# Patient Record
Sex: Female | Born: 1935 | Race: White | Hispanic: No | Smoking: Never smoker
Health system: Southern US, Community
[De-identification: ages and names within clinical notes are randomized; demographics above are authoritative.]

## PROBLEM LIST (undated history)

## (undated) DIAGNOSIS — I10 Essential (primary) hypertension: Secondary | ICD-10-CM

## (undated) DIAGNOSIS — E039 Hypothyroidism, unspecified: Secondary | ICD-10-CM

## (undated) DIAGNOSIS — R609 Edema, unspecified: Secondary | ICD-10-CM

## (undated) DIAGNOSIS — H8109 Meniere's disease, unspecified ear: Secondary | ICD-10-CM

## (undated) DIAGNOSIS — G2581 Restless legs syndrome: Secondary | ICD-10-CM

## (undated) DIAGNOSIS — I509 Heart failure, unspecified: Secondary | ICD-10-CM

## (undated) DIAGNOSIS — I4891 Unspecified atrial fibrillation: Secondary | ICD-10-CM

## (undated) DIAGNOSIS — F419 Anxiety disorder, unspecified: Secondary | ICD-10-CM

## (undated) HISTORY — PX: ABDOMINAL HYSTERECTOMY: SHX81

## (undated) HISTORY — PX: CATARACT EXTRACTION: SUR2

## (undated) HISTORY — PX: THYROID SURGERY: SHX805

## (undated) HISTORY — PX: CHOLECYSTECTOMY: SHX55

---

## 2003-07-05 ENCOUNTER — Other Ambulatory Visit: Payer: Self-pay

## 2003-07-07 ENCOUNTER — Other Ambulatory Visit: Payer: Self-pay

## 2004-03-16 ENCOUNTER — Ambulatory Visit: Payer: Self-pay | Admitting: Internal Medicine

## 2004-07-21 ENCOUNTER — Ambulatory Visit: Payer: Self-pay | Admitting: Internal Medicine

## 2004-10-08 ENCOUNTER — Inpatient Hospital Stay: Payer: Self-pay | Admitting: Internal Medicine

## 2005-03-20 ENCOUNTER — Ambulatory Visit: Payer: Self-pay | Admitting: Internal Medicine

## 2005-04-03 ENCOUNTER — Ambulatory Visit: Payer: Self-pay | Admitting: Internal Medicine

## 2006-03-21 ENCOUNTER — Ambulatory Visit: Payer: Self-pay | Admitting: Internal Medicine

## 2007-03-25 ENCOUNTER — Ambulatory Visit: Payer: Self-pay | Admitting: Internal Medicine

## 2007-10-22 ENCOUNTER — Ambulatory Visit: Payer: Self-pay | Admitting: Internal Medicine

## 2008-10-14 ENCOUNTER — Ambulatory Visit: Payer: Self-pay | Admitting: Internal Medicine

## 2009-03-09 ENCOUNTER — Ambulatory Visit: Payer: Self-pay | Admitting: Internal Medicine

## 2009-08-23 ENCOUNTER — Ambulatory Visit: Payer: Self-pay | Admitting: Internal Medicine

## 2009-12-12 ENCOUNTER — Emergency Department: Payer: Self-pay | Admitting: Emergency Medicine

## 2010-01-20 ENCOUNTER — Ambulatory Visit: Payer: Self-pay | Admitting: Internal Medicine

## 2010-04-26 ENCOUNTER — Ambulatory Visit: Payer: Self-pay | Admitting: Internal Medicine

## 2010-05-11 ENCOUNTER — Inpatient Hospital Stay: Payer: Self-pay | Admitting: Internal Medicine

## 2010-07-31 ENCOUNTER — Inpatient Hospital Stay: Payer: Self-pay | Admitting: Internal Medicine

## 2011-02-06 ENCOUNTER — Ambulatory Visit: Payer: Self-pay | Admitting: Internal Medicine

## 2011-03-29 ENCOUNTER — Ambulatory Visit: Payer: Self-pay | Admitting: Internal Medicine

## 2011-03-29 LAB — BASIC METABOLIC PANEL
Anion Gap: 7 (ref 7–16)
BUN: 20 mg/dL — ABNORMAL HIGH (ref 7–18)
Calcium, Total: 9.6 mg/dL (ref 8.5–10.1)
Chloride: 101 mmol/L (ref 98–107)
Co2: 32 mmol/L (ref 21–32)
Creatinine: 0.9 mg/dL (ref 0.60–1.30)
EGFR (African American): >60
EGFR (Non-African Amer.): >60
Glucose: 114 mg/dL — ABNORMAL HIGH (ref 65–99)
Osmolality: 283 (ref 275–301)
Potassium: 4.2 mmol/L (ref 3.5–5.1)
Sodium: 140 mmol/L (ref 136–145)

## 2011-03-29 LAB — CBC WITH DIFFERENTIAL/PLATELET
Basophil #: 0 10*3/uL (ref 0.0–0.1)
Basophil %: 0.3 %
Eosinophil #: 0 10*3/uL (ref 0.0–0.7)
Eosinophil %: 0.6 %
HCT: 34.2 % — ABNORMAL LOW (ref 35.0–47.0)
HGB: 10.9 g/dL — ABNORMAL LOW (ref 12.0–16.0)
Lymphocyte #: 0.7 10*3/uL — ABNORMAL LOW (ref 1.0–3.6)
Lymphocyte %: 11.1 %
MCH: 27.2 pg (ref 26.0–34.0)
MCHC: 31.9 g/dL — ABNORMAL LOW (ref 32.0–36.0)
MCV: 85 fL (ref 80–100)
Monocyte #: 0.5 10*3/uL (ref 0.0–0.7)
Monocyte %: 7.6 %
Neutrophil #: 4.8 10*3/uL (ref 1.4–6.5)
Neutrophil %: 80.4 %
Platelet: 186 10*3/uL (ref 150–440)
RBC: 4.01 10*6/uL (ref 3.80–5.20)
RDW: 18 % — ABNORMAL HIGH (ref 11.5–14.5)
WBC: 6 10*3/uL (ref 3.6–11.0)

## 2011-03-29 LAB — TSH: Thyroid Stimulating Horm: 3.67 u[IU]/mL

## 2011-03-29 LAB — TROPONIN I: Troponin-I: 0.04 ng/mL

## 2011-03-29 LAB — CK: CK, Total: 87 U/L (ref 21–215)

## 2011-03-29 LAB — CK-MB: CK-MB: 3.9 ng/mL — ABNORMAL HIGH (ref 0.5–3.6)

## 2011-04-19 ENCOUNTER — Ambulatory Visit: Payer: Self-pay | Admitting: Internal Medicine

## 2013-05-01 ENCOUNTER — Ambulatory Visit: Payer: Self-pay | Admitting: Internal Medicine

## 2014-02-27 DIAGNOSIS — I4891 Unspecified atrial fibrillation: Secondary | ICD-10-CM | POA: Diagnosis not present

## 2014-02-27 DIAGNOSIS — I509 Heart failure, unspecified: Secondary | ICD-10-CM | POA: Diagnosis not present

## 2014-02-27 DIAGNOSIS — Z880 Allergy status to penicillin: Secondary | ICD-10-CM | POA: Diagnosis not present

## 2014-02-27 DIAGNOSIS — Z7901 Long term (current) use of anticoagulants: Secondary | ICD-10-CM | POA: Diagnosis not present

## 2014-02-27 DIAGNOSIS — I517 Cardiomegaly: Secondary | ICD-10-CM | POA: Diagnosis not present

## 2014-02-28 DIAGNOSIS — G4733 Obstructive sleep apnea (adult) (pediatric): Secondary | ICD-10-CM | POA: Diagnosis not present

## 2014-03-12 DIAGNOSIS — I4891 Unspecified atrial fibrillation: Secondary | ICD-10-CM | POA: Diagnosis not present

## 2014-03-13 ENCOUNTER — Ambulatory Visit: Payer: Self-pay | Admitting: Internal Medicine

## 2014-03-13 DIAGNOSIS — I517 Cardiomegaly: Secondary | ICD-10-CM | POA: Diagnosis not present

## 2014-03-13 DIAGNOSIS — R0602 Shortness of breath: Secondary | ICD-10-CM | POA: Diagnosis not present

## 2014-03-13 DIAGNOSIS — J90 Pleural effusion, not elsewhere classified: Secondary | ICD-10-CM | POA: Diagnosis not present

## 2014-03-17 DIAGNOSIS — I4891 Unspecified atrial fibrillation: Secondary | ICD-10-CM | POA: Diagnosis not present

## 2014-03-17 DIAGNOSIS — I517 Cardiomegaly: Secondary | ICD-10-CM | POA: Diagnosis not present

## 2014-03-17 DIAGNOSIS — E784 Other hyperlipidemia: Secondary | ICD-10-CM | POA: Diagnosis not present

## 2014-03-17 DIAGNOSIS — I509 Heart failure, unspecified: Secondary | ICD-10-CM | POA: Diagnosis not present

## 2014-03-30 DIAGNOSIS — G4733 Obstructive sleep apnea (adult) (pediatric): Secondary | ICD-10-CM | POA: Diagnosis not present

## 2014-03-31 DIAGNOSIS — E784 Other hyperlipidemia: Secondary | ICD-10-CM | POA: Diagnosis not present

## 2014-03-31 DIAGNOSIS — I4891 Unspecified atrial fibrillation: Secondary | ICD-10-CM | POA: Diagnosis not present

## 2014-03-31 DIAGNOSIS — R5381 Other malaise: Secondary | ICD-10-CM | POA: Diagnosis not present

## 2014-03-31 DIAGNOSIS — I10 Essential (primary) hypertension: Secondary | ICD-10-CM | POA: Diagnosis not present

## 2014-03-31 DIAGNOSIS — I509 Heart failure, unspecified: Secondary | ICD-10-CM | POA: Diagnosis not present

## 2014-03-31 DIAGNOSIS — G2581 Restless legs syndrome: Secondary | ICD-10-CM | POA: Diagnosis not present

## 2014-04-29 DIAGNOSIS — G4733 Obstructive sleep apnea (adult) (pediatric): Secondary | ICD-10-CM | POA: Diagnosis not present

## 2014-05-04 DIAGNOSIS — I4891 Unspecified atrial fibrillation: Secondary | ICD-10-CM | POA: Diagnosis not present

## 2014-05-04 DIAGNOSIS — I10 Essential (primary) hypertension: Secondary | ICD-10-CM | POA: Diagnosis not present

## 2014-05-04 DIAGNOSIS — I509 Heart failure, unspecified: Secondary | ICD-10-CM | POA: Diagnosis not present

## 2014-05-30 DIAGNOSIS — G4733 Obstructive sleep apnea (adult) (pediatric): Secondary | ICD-10-CM | POA: Diagnosis not present

## 2014-06-01 DIAGNOSIS — I4891 Unspecified atrial fibrillation: Secondary | ICD-10-CM | POA: Diagnosis not present

## 2014-06-01 DIAGNOSIS — I209 Angina pectoris, unspecified: Secondary | ICD-10-CM | POA: Diagnosis not present

## 2014-06-01 DIAGNOSIS — E784 Other hyperlipidemia: Secondary | ICD-10-CM | POA: Diagnosis not present

## 2014-06-01 DIAGNOSIS — I10 Essential (primary) hypertension: Secondary | ICD-10-CM | POA: Diagnosis not present

## 2014-06-29 DIAGNOSIS — G4733 Obstructive sleep apnea (adult) (pediatric): Secondary | ICD-10-CM | POA: Diagnosis not present

## 2014-07-06 DIAGNOSIS — I4891 Unspecified atrial fibrillation: Secondary | ICD-10-CM | POA: Diagnosis not present

## 2014-07-06 DIAGNOSIS — G2581 Restless legs syndrome: Secondary | ICD-10-CM | POA: Diagnosis not present

## 2014-07-06 DIAGNOSIS — I209 Angina pectoris, unspecified: Secondary | ICD-10-CM | POA: Diagnosis not present

## 2014-07-06 DIAGNOSIS — M7051 Other bursitis of knee, right knee: Secondary | ICD-10-CM | POA: Diagnosis not present

## 2014-07-06 DIAGNOSIS — I509 Heart failure, unspecified: Secondary | ICD-10-CM | POA: Diagnosis not present

## 2014-07-13 DIAGNOSIS — H40013 Open angle with borderline findings, low risk, bilateral: Secondary | ICD-10-CM | POA: Diagnosis not present

## 2014-07-13 DIAGNOSIS — H524 Presbyopia: Secondary | ICD-10-CM | POA: Diagnosis not present

## 2014-07-13 DIAGNOSIS — H35033 Hypertensive retinopathy, bilateral: Secondary | ICD-10-CM | POA: Diagnosis not present

## 2014-07-30 DIAGNOSIS — G4733 Obstructive sleep apnea (adult) (pediatric): Secondary | ICD-10-CM | POA: Diagnosis not present

## 2014-08-10 DIAGNOSIS — E784 Other hyperlipidemia: Secondary | ICD-10-CM | POA: Diagnosis not present

## 2014-08-10 DIAGNOSIS — N182 Chronic kidney disease, stage 2 (mild): Secondary | ICD-10-CM | POA: Diagnosis not present

## 2014-08-10 DIAGNOSIS — I130 Hypertensive heart and chronic kidney disease with heart failure and stage 1 through stage 4 chronic kidney disease, or unspecified chronic kidney disease: Secondary | ICD-10-CM | POA: Diagnosis not present

## 2014-08-10 DIAGNOSIS — I4891 Unspecified atrial fibrillation: Secondary | ICD-10-CM | POA: Diagnosis not present

## 2014-08-28 ENCOUNTER — Inpatient Hospital Stay
Admission: EM | Admit: 2014-08-28 | Discharge: 2014-09-01 | DRG: 291 | Disposition: A | Discharge status: Skilled Nursing Facility | Payer: Medicare Other | Attending: Internal Medicine | Admitting: Internal Medicine

## 2014-08-28 ENCOUNTER — Encounter: Payer: Self-pay | Admitting: Emergency Medicine

## 2014-08-28 ENCOUNTER — Emergency Department: Payer: Medicare Other

## 2014-08-28 ENCOUNTER — Inpatient Hospital Stay
Admit: 2014-08-28 | Discharge: 2014-08-28 | Disposition: A | Discharge status: Home / Self Care | Payer: Medicare Other | Attending: Internal Medicine | Admitting: Internal Medicine

## 2014-08-28 ENCOUNTER — Inpatient Hospital Stay: Payer: Medicare Other

## 2014-08-28 DIAGNOSIS — L539 Erythematous condition, unspecified: Secondary | ICD-10-CM | POA: Diagnosis not present

## 2014-08-28 DIAGNOSIS — N183 Chronic kidney disease, stage 3 (moderate): Secondary | ICD-10-CM | POA: Diagnosis present

## 2014-08-28 DIAGNOSIS — J811 Chronic pulmonary edema: Secondary | ICD-10-CM | POA: Diagnosis not present

## 2014-08-28 DIAGNOSIS — I480 Paroxysmal atrial fibrillation: Secondary | ICD-10-CM | POA: Diagnosis present

## 2014-08-28 DIAGNOSIS — E8770 Fluid overload, unspecified: Secondary | ICD-10-CM

## 2014-08-28 DIAGNOSIS — I4891 Unspecified atrial fibrillation: Secondary | ICD-10-CM | POA: Diagnosis not present

## 2014-08-28 DIAGNOSIS — D649 Anemia, unspecified: Secondary | ICD-10-CM | POA: Diagnosis present

## 2014-08-28 DIAGNOSIS — E039 Hypothyroidism, unspecified: Secondary | ICD-10-CM | POA: Diagnosis not present

## 2014-08-28 DIAGNOSIS — T460X1A Poisoning by cardiac-stimulant glycosides and drugs of similar action, accidental (unintentional), initial encounter: Secondary | ICD-10-CM

## 2014-08-28 DIAGNOSIS — E876 Hypokalemia: Secondary | ICD-10-CM | POA: Diagnosis not present

## 2014-08-28 DIAGNOSIS — I340 Nonrheumatic mitral (valve) insufficiency: Secondary | ICD-10-CM | POA: Diagnosis present

## 2014-08-28 DIAGNOSIS — Z823 Family history of stroke: Secondary | ICD-10-CM

## 2014-08-28 DIAGNOSIS — R001 Bradycardia, unspecified: Secondary | ICD-10-CM | POA: Diagnosis not present

## 2014-08-28 DIAGNOSIS — R0600 Dyspnea, unspecified: Secondary | ICD-10-CM

## 2014-08-28 DIAGNOSIS — R710 Precipitous drop in hematocrit: Secondary | ICD-10-CM | POA: Diagnosis not present

## 2014-08-28 DIAGNOSIS — I5033 Acute on chronic diastolic (congestive) heart failure: Principal | ICD-10-CM | POA: Diagnosis present

## 2014-08-28 DIAGNOSIS — Z9981 Dependence on supplemental oxygen: Secondary | ICD-10-CM | POA: Diagnosis not present

## 2014-08-28 DIAGNOSIS — I10 Essential (primary) hypertension: Secondary | ICD-10-CM | POA: Diagnosis not present

## 2014-08-28 DIAGNOSIS — E8779 Other fluid overload: Secondary | ICD-10-CM

## 2014-08-28 DIAGNOSIS — F419 Anxiety disorder, unspecified: Secondary | ICD-10-CM | POA: Diagnosis present

## 2014-08-28 DIAGNOSIS — T460X5D Adverse effect of cardiac-stimulant glycosides and drugs of similar action, subsequent encounter: Secondary | ICD-10-CM | POA: Diagnosis not present

## 2014-08-28 DIAGNOSIS — J9811 Atelectasis: Secondary | ICD-10-CM | POA: Diagnosis not present

## 2014-08-28 DIAGNOSIS — K219 Gastro-esophageal reflux disease without esophagitis: Secondary | ICD-10-CM | POA: Diagnosis not present

## 2014-08-28 DIAGNOSIS — I442 Atrioventricular block, complete: Secondary | ICD-10-CM | POA: Diagnosis not present

## 2014-08-28 DIAGNOSIS — Z66 Do not resuscitate: Secondary | ICD-10-CM | POA: Diagnosis present

## 2014-08-28 DIAGNOSIS — Z79899 Other long term (current) drug therapy: Secondary | ICD-10-CM

## 2014-08-28 DIAGNOSIS — J9690 Respiratory failure, unspecified, unspecified whether with hypoxia or hypercapnia: Secondary | ICD-10-CM | POA: Diagnosis not present

## 2014-08-28 DIAGNOSIS — Z7901 Long term (current) use of anticoagulants: Secondary | ICD-10-CM

## 2014-08-28 DIAGNOSIS — T460X5A Adverse effect of cardiac-stimulant glycosides and drugs of similar action, initial encounter: Secondary | ICD-10-CM | POA: Diagnosis present

## 2014-08-28 DIAGNOSIS — E785 Hyperlipidemia, unspecified: Secondary | ICD-10-CM | POA: Diagnosis present

## 2014-08-28 DIAGNOSIS — Z90710 Acquired absence of both cervix and uterus: Secondary | ICD-10-CM

## 2014-08-28 DIAGNOSIS — G2581 Restless legs syndrome: Secondary | ICD-10-CM | POA: Diagnosis present

## 2014-08-28 DIAGNOSIS — R0602 Shortness of breath: Secondary | ICD-10-CM | POA: Diagnosis not present

## 2014-08-28 DIAGNOSIS — H8109 Meniere's disease, unspecified ear: Secondary | ICD-10-CM | POA: Diagnosis not present

## 2014-08-28 DIAGNOSIS — M6281 Muscle weakness (generalized): Secondary | ICD-10-CM | POA: Diagnosis not present

## 2014-08-28 DIAGNOSIS — G4733 Obstructive sleep apnea (adult) (pediatric): Secondary | ICD-10-CM | POA: Diagnosis not present

## 2014-08-28 DIAGNOSIS — Z8249 Family history of ischemic heart disease and other diseases of the circulatory system: Secondary | ICD-10-CM | POA: Diagnosis not present

## 2014-08-28 DIAGNOSIS — Z5181 Encounter for therapeutic drug level monitoring: Secondary | ICD-10-CM | POA: Diagnosis not present

## 2014-08-28 DIAGNOSIS — E875 Hyperkalemia: Secondary | ICD-10-CM | POA: Diagnosis present

## 2014-08-28 DIAGNOSIS — I129 Hypertensive chronic kidney disease with stage 1 through stage 4 chronic kidney disease, or unspecified chronic kidney disease: Secondary | ICD-10-CM | POA: Diagnosis present

## 2014-08-28 DIAGNOSIS — I509 Heart failure, unspecified: Secondary | ICD-10-CM | POA: Diagnosis not present

## 2014-08-28 DIAGNOSIS — J9601 Acute respiratory failure with hypoxia: Secondary | ICD-10-CM

## 2014-08-28 DIAGNOSIS — Z9849 Cataract extraction status, unspecified eye: Secondary | ICD-10-CM

## 2014-08-28 DIAGNOSIS — Z9049 Acquired absence of other specified parts of digestive tract: Secondary | ICD-10-CM | POA: Diagnosis not present

## 2014-08-28 DIAGNOSIS — Z515 Encounter for palliative care: Secondary | ICD-10-CM | POA: Diagnosis not present

## 2014-08-28 DIAGNOSIS — J810 Acute pulmonary edema: Secondary | ICD-10-CM

## 2014-08-28 DIAGNOSIS — N179 Acute kidney failure, unspecified: Secondary | ICD-10-CM | POA: Diagnosis not present

## 2014-08-28 DIAGNOSIS — I350 Nonrheumatic aortic (valve) stenosis: Secondary | ICD-10-CM | POA: Diagnosis not present

## 2014-08-28 DIAGNOSIS — I5032 Chronic diastolic (congestive) heart failure: Secondary | ICD-10-CM | POA: Diagnosis not present

## 2014-08-28 DIAGNOSIS — I5031 Acute diastolic (congestive) heart failure: Secondary | ICD-10-CM | POA: Diagnosis not present

## 2014-08-28 HISTORY — DX: Unspecified atrial fibrillation: I48.91

## 2014-08-28 HISTORY — DX: Restless legs syndrome: G25.81

## 2014-08-28 HISTORY — DX: Anxiety disorder, unspecified: F41.9

## 2014-08-28 HISTORY — DX: Edema, unspecified: R60.9

## 2014-08-28 HISTORY — DX: Meniere's disease, unspecified ear: H81.09

## 2014-08-28 HISTORY — DX: Hypothyroidism, unspecified: E03.9

## 2014-08-28 HISTORY — DX: Heart failure, unspecified: I50.9

## 2014-08-28 HISTORY — DX: Essential (primary) hypertension: I10

## 2014-08-28 LAB — BASIC METABOLIC PANEL
ANION GAP: 10 (ref 5–15)
Anion gap: 8 (ref 5–15)
BUN: 42 mg/dL — ABNORMAL HIGH (ref 6–20)
BUN: 43 mg/dL — ABNORMAL HIGH (ref 6–20)
CALCIUM: 11.2 mg/dL — AB (ref 8.9–10.3)
CHLORIDE: 104 mmol/L (ref 101–111)
CO2: 21 mmol/L — ABNORMAL LOW (ref 22–32)
CO2: 27 mmol/L (ref 22–32)
CREATININE: 1.66 mg/dL — AB (ref 0.44–1.00)
Calcium: 9.9 mg/dL (ref 8.9–10.3)
Chloride: 103 mmol/L (ref 101–111)
Creatinine, Ser: 1.83 mg/dL — ABNORMAL HIGH (ref 0.44–1.00)
GFR calc Af Amer: 26 mL/min — ABNORMAL LOW (ref >60)
GFR calc Af Amer: 29 mL/min — ABNORMAL LOW (ref >60)
GFR calc non Af Amer: 25 mL/min — ABNORMAL LOW (ref >60)
GFR, EST NON AFRICAN AMERICAN: 22 mL/min — AB (ref >60)
GLUCOSE: 135 mg/dL — AB (ref 65–99)
Glucose, Bld: 103 mg/dL — ABNORMAL HIGH (ref 65–99)
POTASSIUM: 7 mmol/L — AB (ref 3.5–5.1)
Potassium: 5 mmol/L (ref 3.5–5.1)
SODIUM: 138 mmol/L (ref 135–145)
Sodium: 135 mmol/L (ref 135–145)

## 2014-08-28 LAB — DIGOXIN LEVEL: Digoxin Level: 3.2 ng/mL (ref 0.8–2.0)

## 2014-08-28 LAB — TROPONIN I: TROPONIN I: 0.04 ng/mL — AB (ref <0.031)

## 2014-08-28 LAB — CBC
HCT: 33 % — ABNORMAL LOW (ref 35.0–47.0)
Hemoglobin: 10.3 g/dL — ABNORMAL LOW (ref 12.0–16.0)
MCH: 24.5 pg — AB (ref 26.0–34.0)
MCHC: 31.2 g/dL — ABNORMAL LOW (ref 32.0–36.0)
MCV: 78.5 fL — AB (ref 80.0–100.0)
Platelets: 211 10*3/uL (ref 150–440)
RBC: 4.21 MIL/uL (ref 3.80–5.20)
RDW: 19.2 % — AB (ref 11.5–14.5)
WBC: 6.8 10*3/uL (ref 3.6–11.0)

## 2014-08-28 LAB — MRSA PCR SCREENING: MRSA by PCR: NEGATIVE

## 2014-08-28 LAB — MAGNESIUM: Magnesium: 2.2 mg/dL (ref 1.7–2.4)

## 2014-08-28 LAB — PROTIME-INR
INR: 2.94
Prothrombin Time: 30.7 seconds — ABNORMAL HIGH (ref 11.4–15.0)

## 2014-08-28 LAB — GLUCOSE, CAPILLARY: GLUCOSE-CAPILLARY: 167 mg/dL — AB (ref 65–99)

## 2014-08-28 MED ORDER — SODIUM POLYSTYRENE SULFONATE 15 GM/60ML PO SUSP
30.0000 g | Freq: Once | ORAL | Status: AC
  Administered 2014-08-28: 30 g via ORAL
  Filled 2014-08-28: qty 120

## 2014-08-28 MED ORDER — SODIUM CHLORIDE 0.9 % IV SOLN
1.0000 g | Freq: Once | INTRAVENOUS | Status: DC
  Filled 2014-08-28: qty 10

## 2014-08-28 MED ORDER — SODIUM BICARBONATE 8.4 % IV SOLN
50.0000 meq | Freq: Once | INTRAVENOUS | Status: AC
  Administered 2014-08-28: 50 meq via INTRAVENOUS

## 2014-08-28 MED ORDER — SODIUM CHLORIDE 0.9 % IV SOLN: INTRAVENOUS | Status: DC

## 2014-08-28 MED ORDER — POLYETHYLENE GLYCOL 3350 17 G PO PACK
17.0000 g | PACK | Freq: Every day | ORAL | Status: DC | PRN
  Administered 2014-08-30: 17 g via ORAL
  Filled 2014-08-28 (×2): qty 1

## 2014-08-28 MED ORDER — SODIUM CHLORIDE 0.9 % IJ SOLN
3.0000 mL | Freq: Two times a day (BID) | INTRAMUSCULAR | Status: DC
  Administered 2014-08-28 – 2014-08-31 (×6): 3 mL via INTRAVENOUS

## 2014-08-28 MED ORDER — PANTOPRAZOLE SODIUM 40 MG PO TBEC
40.0000 mg | DELAYED_RELEASE_TABLET | Freq: Every day | ORAL | Status: DC
  Administered 2014-08-28 – 2014-09-01 (×5): 40 mg via ORAL
  Filled 2014-08-28 (×5): qty 1

## 2014-08-28 MED ORDER — PRAMIPEXOLE DIHYDROCHLORIDE 0.25 MG PO TABS
0.7500 mg | ORAL_TABLET | Freq: Every evening | ORAL | Status: DC
  Administered 2014-08-28 – 2014-08-31 (×4): 0.75 mg via ORAL
  Filled 2014-08-28 (×6): qty 3

## 2014-08-28 MED ORDER — WARFARIN SODIUM 1 MG PO TABS: 3.0000 mg | ORAL_TABLET | Freq: Every day | ORAL | Status: DC

## 2014-08-28 MED ORDER — FUROSEMIDE 10 MG/ML IJ SOLN
20.0000 mg | Freq: Once | INTRAMUSCULAR | Status: AC
  Administered 2014-08-28: 20 mg via INTRAVENOUS
  Filled 2014-08-28: qty 2

## 2014-08-28 MED ORDER — ONDANSETRON HCL 4 MG/2ML IJ SOLN
INTRAMUSCULAR | Status: AC
  Administered 2014-08-28: 4 mg via INTRAVENOUS
  Filled 2014-08-28: qty 2

## 2014-08-28 MED ORDER — ONDANSETRON HCL 4 MG/2ML IJ SOLN
4.0000 mg | INTRAMUSCULAR | Status: AC
  Administered 2014-08-28: 4 mg via INTRAVENOUS

## 2014-08-28 MED ORDER — SODIUM BICARBONATE 8.4 % IV SOLN
INTRAVENOUS | Status: DC
  Administered 2014-08-28: 08:00:00 via INTRAVENOUS
  Filled 2014-08-28 (×2): qty 150

## 2014-08-28 MED ORDER — LEVOTHYROXINE SODIUM 75 MCG PO TABS
75.0000 ug | ORAL_TABLET | ORAL | Status: DC
  Administered 2014-08-28 – 2014-09-01 (×5): 75 ug via ORAL
  Filled 2014-08-28 (×5): qty 1

## 2014-08-28 MED ORDER — WARFARIN - PHYSICIAN DOSING INPATIENT
Freq: Every day | Status: DC
Start: 2014-08-28 — End: 2014-08-28

## 2014-08-28 MED ORDER — SIMVASTATIN 20 MG PO TABS
20.0000 mg | ORAL_TABLET | Freq: Every day | ORAL | Status: DC
  Administered 2014-08-28 – 2014-09-01 (×5): 20 mg via ORAL
  Filled 2014-08-28 (×5): qty 1

## 2014-08-28 MED ORDER — DOPAMINE-DEXTROSE 3.2-5 MG/ML-% IV SOLN: 0.0000 ug/kg/min | INTRAVENOUS | Status: DC

## 2014-08-28 MED ORDER — ALPRAZOLAM 0.25 MG PO TABS
0.2500 mg | ORAL_TABLET | ORAL | Status: DC
  Administered 2014-08-28 – 2014-08-31 (×5): 0.25 mg via ORAL
  Filled 2014-08-28 (×5): qty 1

## 2014-08-28 MED ORDER — LORATADINE 10 MG PO TABS
10.0000 mg | ORAL_TABLET | Freq: Every day | ORAL | Status: DC
  Administered 2014-08-28 – 2014-09-01 (×5): 10 mg via ORAL
  Filled 2014-08-28 (×5): qty 1

## 2014-08-28 MED ORDER — SODIUM CHLORIDE 0.9 % IV SOLN
6.0000 | Freq: Once | INTRAVENOUS | Status: AC
  Administered 2014-08-28: 6 via INTRAVENOUS
  Filled 2014-08-28: qty 240

## 2014-08-28 MED ORDER — ACETAMINOPHEN 325 MG PO TABS
650.0000 mg | ORAL_TABLET | Freq: Four times a day (QID) | ORAL | Status: DC | PRN
  Administered 2014-08-31 – 2014-09-01 (×4): 650 mg via ORAL
  Filled 2014-08-28 (×4): qty 2

## 2014-08-28 MED ORDER — INSULIN ASPART 100 UNIT/ML ~~LOC~~ SOLN
10.0000 [IU] | Freq: Once | SUBCUTANEOUS | Status: AC
  Administered 2014-08-28: 10 [IU] via INTRAVENOUS
  Filled 2014-08-28: qty 10

## 2014-08-28 MED ORDER — DEXTROSE 50 % IV SOLN
1.0000 | Freq: Once | INTRAVENOUS | Status: AC
Start: 2014-08-28 — End: 2014-08-28
  Administered 2014-08-28: 50 mL via INTRAVENOUS
  Filled 2014-08-28: qty 50

## 2014-08-28 MED ORDER — CEPHALEXIN 250 MG PO CAPS
250.0000 mg | ORAL_CAPSULE | Freq: Two times a day (BID) | ORAL | Status: DC
  Administered 2014-08-28 – 2014-09-01 (×9): 250 mg via ORAL
  Filled 2014-08-28 (×10): qty 1

## 2014-08-28 MED ORDER — CALCIUM CHLORIDE 10 % IV SOLN
1.0000 g | Freq: Once | INTRAVENOUS | Status: AC
  Administered 2014-08-28: 1 g via INTRAVENOUS

## 2014-08-28 MED ORDER — CETYLPYRIDINIUM CHLORIDE 0.05 % MT LIQD
7.0000 mL | Freq: Two times a day (BID) | OROMUCOSAL | Status: DC
  Administered 2014-08-28 – 2014-08-31 (×6): 7 mL via OROMUCOSAL

## 2014-08-28 MED ORDER — SODIUM CHLORIDE 0.9 % IV BOLUS (SEPSIS)
1000.0000 mL | Freq: Once | INTRAVENOUS | Status: AC
  Administered 2014-08-28: 1000 mL via INTRAVENOUS

## 2014-08-28 MED ORDER — ACETAMINOPHEN 650 MG RE SUPP: 650.0000 mg | Freq: Four times a day (QID) | RECTAL | Status: DC | PRN

## 2014-08-28 MED ORDER — DOPAMINE-DEXTROSE 3.2-5 MG/ML-% IV SOLN
0.0000 ug/kg/min | Freq: Once | INTRAVENOUS | Status: AC
  Administered 2014-08-28: 33.464 ug/kg/min via INTRAVENOUS

## 2014-08-28 MED ORDER — FUROSEMIDE 10 MG/ML IJ SOLN: 60.0000 mg | Freq: Once | INTRAMUSCULAR | Status: DC

## 2014-08-28 MED ORDER — FUROSEMIDE 10 MG/ML IJ SOLN
60.0000 mg | Freq: Once | INTRAMUSCULAR | Status: AC
  Administered 2014-08-28: 60 mg via INTRAVENOUS

## 2014-08-28 MED ORDER — DOPAMINE-DEXTROSE 3.2-5 MG/ML-% IV SOLN
INTRAVENOUS | Status: AC
  Administered 2014-08-28: 33.464 ug/kg/min via INTRAVENOUS
  Filled 2014-08-28: qty 250

## 2014-08-28 NOTE — ED Notes (Signed)
Lab called with critical value Dig 3.7, notified [place]  At bedside.

## 2014-08-28 NOTE — H&P (Signed)
[facility] [place] at [facility]   PATIENT NAME: Claudia Warren    MR#:  [id number]  DATE OF BIRTH:  1917/06/20  DATE OF ADMISSION:  08/28/2014  PRIMARY CARE PHYSICIAN: Corky Downs  REQUESTING/REFERRING PHYSICIAN: Sharyn Creamer  CHIEF COMPLAINT:   Chief Complaint  Patient presents with  . Respiratory Distress  . Weakness    HISTORY OF PRESENT ILLNESS:  Claudia Warren  is a 81 y.o. female with a known history of CHF, hypertension, atrial fibrillation, edema. She presents with not feeling well and some shortness of breath. This morning she felt very dizzy and not feeling well at all. In the emergency room she was found to be bradycardic in junctional rhythm, acute renal failure and hyperkalemia. Hospitalist services were contacted for admission. The ER physician contacted Dr. Gwen Pounds cardiology urgently to evaluate need for pacemaker.  PAST MEDICAL HISTORY:   Past Medical History  Diagnosis Date  . CHF (congestive heart failure)   . Hypertension   . Anxiety   . Hypothyroidism   . Atrial fibrillation   . Edema   . Restless leg   . Meniere disease     PAST SURGICAL HISTORY:   Past Surgical History  Procedure Laterality Date  . Abdominal hysterectomy    . Cholecystectomy    . Cataract extraction N/A   . Thyroid surgery N/A     SOCIAL HISTORY:   History  Substance Use Topics  . Smoking status: Never Smoker   . Smokeless tobacco: Never Used  . Alcohol Use: No    FAMILY HISTORY:   Family History  Problem Relation Age of Onset  . CVA Mother   . CAD Father   . CAD Sister   . CAD Brother     DRUG ALLERGIES:   Allergies  Allergen Reactions  . Sulfa Antibiotics   . Penicillins Rash    REVIEW OF SYSTEMS:  CONSTITUTIONAL: No fever, positive for fatigue.  EYES: No blurred or double vision. Wears glasses. EARS, NOSE, AND THROAT: No tinnitus or ear pain. No sore throat. RESPIRATORY: No cough, positive for shortness of breath, no  wheezing or hemoptysis.  CARDIOVASCULAR: No chest pain, positive for edema.  GASTROINTESTINAL: No nausea, vomiting, diarrhea or abdominal pain. No blood in bowel movements GENITOURINARY: No dysuria, hematuria.  ENDOCRINE: No polyuria, nocturia,  HEMATOLOGY: No anemia, easy bruising or bleeding SKIN: Patient states that she's had lower extremity redness for couple days MUSCULOSKELETAL: No joint pain or arthritis.   NEUROLOGIC: No tingling, numbness, weakness.  PSYCHIATRY: Positive for anxiety, no depression.   MEDICATIONS AT HOME:   Prior to Admission medications   Medication Sig Start Date End Date Taking? Authorizing Provider  ALPRAZolam Prudy Feeler) 0.25 MG tablet Take 1 tablet by mouth every morning. 06/12/14   Historical Provider, MD  DIGOX 125 MCG tablet Take 1 tablet by mouth daily. 08/11/14   Historical Provider, MD  furosemide (LASIX) 40 MG tablet Take 1 tablet by mouth daily. 08/11/14   Historical Provider, MD  levothyroxine (SYNTHROID, LEVOTHROID) 75 MCG tablet Take 1 tablet by mouth daily. 08/11/14   Historical Provider, MD  metoprolol tartrate (LOPRESSOR) 25 MG tablet Take 1 tablet by mouth 3 (three) times daily. 08/11/14   Historical Provider, MD  omeprazole (PRILOSEC) 20 MG capsule Take 1 capsule by mouth every morning. 08/13/14   Historical Provider, MD  potassium chloride SA (K-DUR,KLOR-CON) 20 MEQ tablet Take 1 tablet by mouth daily. 08/11/14   Historical Provider, MD  simvastatin (ZOCOR) 20 MG  tablet Take 1 tablet by mouth daily. 08/11/14   Historical Provider, MD  spironolactone (ALDACTONE) 25 MG tablet Take 1 tablet by mouth daily. 08/11/14   Historical Provider, MD  warfarin (COUMADIN) 3 MG tablet Take 1 tablet by mouth daily. 08/11/14   Historical Provider, MD      VITAL SIGNS:  Blood pressure 156/63, pulse 66, temperature 97.4 F (36.3 C), temperature source Axillary, resp. rate 28, height 5' (1.524 m), weight 65.772 kg (145 lb), SpO2 98 %.  PHYSICAL EXAMINATION:  GENERAL:   81 y.o.-year-old patient lying in the bed with no acute distress.  EYES: Pupils equal, round, reactive to light and accommodation. No scleral icterus. Extraocular muscles intact.  HEENT: Head atraumatic, normocephalic. Oropharynx and nasopharynx clear.  NECK:  Supple, no jugular venous distention. No thyroid enlargement, no tenderness.  LUNGS: Normal breath sounds bilaterally, no wheezing, rales,rhonchi or crepitation. No use of accessory muscles of respiration.  CARDIOVASCULAR: S1, S2 bradycardia. 3/6 systolic murmur, no rubs, or gallops.  ABDOMEN: Soft, nontender, nondistended. Bowel sounds present. No organomegaly or mass.  EXTREMITIES: 3+ edema, no cyanosis, or clubbing.  NEUROLOGIC: Cranial nerves II through XII are intact. Muscle strength 5/5 in all extremities. Sensation intact. Gait not checked.  PSYCHIATRIC: The patient is alert and oriented x 3.  SKIN: Erythema bilateral lower extremities on the shins anteriorly without warmth.  LABORATORY PANEL:   CBC  Recent Labs Lab 08/28/14 0612  WBC 6.8  HGB 10.3*  HCT 33.0*  PLT 211   Chemistries   Recent Labs Lab 08/28/14 0612  NA 135  K 7.0*  CL 104  CO2 21*  GLUCOSE 135*  BUN 42*  CREATININE 1.83*  CALCIUM 9.9  MG 2.2    Cardiac Enzymes  Recent Labs Lab 08/28/14 0612  TROPONINI 0.04*    RADIOLOGY:  Dg Chest Port 1 View  08/28/2014   CLINICAL DATA:  Awoke with weakness and shortness of breath.  EXAM: PORTABLE CHEST - 1 VIEW  COMPARISON:  Frontal and lateral views 03/13/2014  FINDINGS: Cardiomegaly and atherosclerotic periaortic, unchanged. Mild prominent central vascular congestion. Blunting of right costophrenic angle is unchanged. No consolidation. No pneumothorax. Degenerative changes of both shoulders.  IMPRESSION: Stable cardiomegaly, prominent pulmonary vasculature, and small right pleural effusion. No definite acute process.   Electronically Signed   By: Rubye Oaks M.D.   On: 08/28/2014 06:34    EKG:    Junctional rhythm 27 bpm flipped T waves V2 through V6 and 23 and aVF  IMPRESSION AND PLAN:   1. Symptomatic bradycardia with junctional rhythm. Likely due to digoxin toxicity and metoprolol use in combination with acute renal failure. Patient will be admitted to the CCU for close monitoring. Patient was placed on dopamine drip. Digibind given to reverse digoxin. Calcium chloride given for cardioprotection and reversal of beta blocker. Heart rate currently in the 50s. No indication for pacemaker at this point but close monitoring in CCU is needed. Of course we will hold digoxin and metoprolol at this time. 2. Severe hyperkalemia and acute renal failure. Calcium chloride given for cardioprotection. Insulin, D50 and sodium bicarbonate push is given. Patient on sodium bicarbonate drip. Continue hydration. ER physician spoke with nephrologist to see the patient. Since heart rate is better, hopefully we can manage without dialysis. I will order Kayexalate also to lower potassium. Recheck a BMP at 12 noon. Of course, we will stop potassium supplementation spironolactone and Lasix at this point. 3. History of atrial fibrillation- anticoagulated with Coumadin. 4. History  of congestive heart failure- I will obtain an echocardiogram to see if she also has aortic stenosis. Watch closely with hydration. Currently no signs of heart failure. 5. Hyperlipidemia unspecified continue simvastatin. 6. Gastroesophageal reflux disease without esophagitis on omeprazole at home will continue Protonix here. 7. Anxiety continue Xanax. 8. Lower extremity edema and it may get worse while given IV fluids. 9. Lower extremity erythema- not sure if this is a cellulitis or not. Start by mouth Keflex low-dose. 10 restless leg syndrome continue Mirapex.  All the records are reviewed and case discussed with ED provider. Management plans discussed with the patient, family and they are in agreement.  CODE STATUS: DO NOT  RESUSCITATE  TOTAL TIME TAKING CARE OF THIS PATIENT: 55 minutes of critical care time.    Alford Highland M.D on 08/28/2014 at 8:25 AM  Between 7am to 6pm - Pager - [phone]  After 6pm call admission pager [phone]  [facility]  Office  [phone]  CC: Primary care physician; Dr Corky Downs

## 2014-08-28 NOTE — Assessment & Plan Note (Signed)
-  The patient and family have opted for DO NOT RESUSCITATE status without dialysis. -Therefore we're managing conservatively, will continue to monitor her progress if her status does not improve will continue changing her status to comfort measures only.

## 2014-08-28 NOTE — Plan of Care (Signed)
Problem: Phase I Progression Outcomes Goal: EF % per last Echo/documented,Core Reminder form on chart Outcome: Completed/Met Date Met:  08/28/14 EF 65-70% per echo performed on 08-28-14.

## 2014-08-28 NOTE — Progress Notes (Signed)
*  PRELIMINARY RESULTS* Echocardiogram 2D Echocardiogram has been performed.  Georgann Housekeeper Hege 08/28/2014, 1:12 PM

## 2014-08-28 NOTE — ED Notes (Signed)
Pt presents to ED from [facility] after waking up feeling weak and short of breath. Pt denies pain. Pt currently answering questions without difficulty and no increased work of breathing noted at this time. Denies nausea currently.

## 2014-08-28 NOTE — Assessment & Plan Note (Signed)
-  Severe aortic stenosis.

## 2014-08-28 NOTE — Assessment & Plan Note (Addendum)
monitor

## 2014-08-28 NOTE — Assessment & Plan Note (Addendum)
-  Will continue to monitor the patient appears to be improved. -This is likely multifactorial from acute renal failure as well as severe aortic stenosis.

## 2014-08-28 NOTE — Progress Notes (Signed)
*  PRELIMINARY RESULTS* °Echocardiogram °2D Echocardiogram has been performed. ° °Claudia Warren °08/28/2014, 1:12 PM °

## 2014-08-28 NOTE — Assessment & Plan Note (Addendum)
-  The patient is now doing better. She has been weaned off BiPAP and is currently on a nasal cannula.

## 2014-08-28 NOTE — Consult Note (Signed)
[facility] Consultation Note  Patient ID: Claudia Warren, MRN: [id number], DOB/AGE: 02-05-1917 81 y.o. Admit date: 08/28/2014   Date of Consult: 08/28/2014 Primary Physician: No primary care provider on file. Primary Cardiologist: None  Chief Complaint:  Chief Complaint  Patient presents with  . Respiratory Distress  . Weakness   Reason for Consult: acute bradycardia  HPI: 81 y.o. female with known nonvalvular paroxysmal atrial fibrillation with a moderate to severe aortic valve stenosis with appropriate medication management now with acute failure and significant bradycardia with junctional escape consistent with possible medications and hyperkalemia. And has been treated with dopamine for improvements of heart rate into the 60 beat per minute range with treatment of hyperkalemia with insulin and glucose with glucagon now with better blood pressure and heart rate O current evidence of myocardial infarction and/or congestive heart failure. He does appear her bradycardia is multifactorial in nature. Chin does feel slightly better after improvements of nausea likely secondary to high digoxin levels.  Past Medical History  Diagnosis Date  . CHF (congestive heart failure)   . Hypertension   . Anxiety   . Hypothyroidism   . Atrial fibrillation   . Edema   . Restless leg   . Meniere disease       Surgical History:  Past Surgical History  Procedure Laterality Date  . Abdominal hysterectomy    . Cholecystectomy    . Cataract extraction N/A   . Thyroid surgery N/A      Home Meds: Prior to Admission medications   Medication Sig Start Date End Date Taking? Authorizing Provider  ALPRAZolam Prudy Feeler) 0.25 MG tablet Take 1 tablet by mouth every morning. 06/12/14  Yes Historical Provider, MD  DIGOX 125 MCG tablet Take 1 tablet by mouth daily. 08/11/14  Yes Historical Provider, MD  furosemide (LASIX) 40 MG tablet Take 1 tablet by mouth daily. 08/11/14  Yes Historical Provider, MD   levothyroxine (SYNTHROID, LEVOTHROID) 75 MCG tablet Take 1 tablet by mouth daily. 08/11/14  Yes Historical Provider, MD  loratadine (CLARITIN) 10 MG tablet Take 10 mg by mouth daily.   Yes Historical Provider, MD  metoprolol tartrate (LOPRESSOR) 25 MG tablet Take 1 tablet by mouth 3 (three) times daily. 08/11/14  Yes Historical Provider, MD  omeprazole (PRILOSEC) 20 MG capsule Take 1 capsule by mouth every morning. 08/13/14  Yes Historical Provider, MD  potassium chloride SA (K-DUR,KLOR-CON) 20 MEQ tablet Take 1 tablet by mouth daily. 08/11/14  Yes Historical Provider, MD  pramipexole (MIRAPEX) 0.75 MG tablet Take 0.75 mg by mouth every evening.   Yes Historical Provider, MD  simvastatin (ZOCOR) 20 MG tablet Take 1 tablet by mouth daily. 08/11/14  Yes Historical Provider, MD  spironolactone (ALDACTONE) 25 MG tablet Take 1 tablet by mouth daily. 08/11/14  Yes Historical Provider, MD  warfarin (COUMADIN) 3 MG tablet Take 1 tablet by mouth daily. 08/11/14  Yes Historical Provider, MD    Inpatient Medications:  . sodium chloride  3 mL Intravenous Q12H   .  sodium bicarbonate  infusion 1000 mL 50 mL/hr at 08/28/14 0758    Allergies:  Allergies  Allergen Reactions  . Sulfa Antibiotics   . Penicillins Rash    History   Social History  . Marital Status: Widowed    Spouse Name: N/A  . Number of Children: N/A  . Years of Education: N/A   Occupational History  . Not on file.   Social History Main Topics  . Smoking status: Never Smoker   .  Smokeless tobacco: Never Used  . Alcohol Use: No  . Drug Use: No  . Sexual Activity: No   Other Topics Concern  . Not on file   Social History Narrative  . No narrative on file     Family History  Problem Relation Age of Onset  . CVA Mother   . CAD Father   . CAD Sister   . CAD Brother      Review of Systems Positive for nausea Negative for: General:  chills, fever, night sweats or weight changes.  Cardiovascular: PND orthopnea syncope  dizziness  Dermatological skin lesions rashes Respiratory: Cough congestion Urologic: Frequent urination urination at night and hematuria Abdominal: negative for  diarrhea, bright red blood per rectum, melena, or hematemesis Neurologic: negative for visual changes, and/or hearing changes  All other systems reviewed and are otherwise negative except as noted above.  Labs:  Recent Labs  08/28/14 0612  TROPONINI 0.04*   Lab Results  Component Value Date   WBC 6.8 08/28/2014   HGB 10.3* 08/28/2014   HCT 33.0* 08/28/2014   MCV 78.5* 08/28/2014   PLT 211 08/28/2014    Recent Labs Lab 08/28/14 0612  NA 135  K 7.0*  CL 104  CO2 21*  BUN 42*  CREATININE 1.83*  CALCIUM 9.9  GLUCOSE 135*   No results found for: CHOL, HDL, LDLCALC, TRIG No results found for: DDIMER  Radiology/Studies:  Dg Chest Port 1 View  08/28/2014   CLINICAL DATA:  Awoke with weakness and shortness of breath.  EXAM: PORTABLE CHEST - 1 VIEW  COMPARISON:  Frontal and lateral views 03/13/2014  FINDINGS: Cardiomegaly and atherosclerotic periaortic, unchanged. Mild prominent central vascular congestion. Blunting of right costophrenic angle is unchanged. No consolidation. No pneumothorax. Degenerative changes of both shoulders.  IMPRESSION: Stable cardiomegaly, prominent pulmonary vasculature, and small right pleural effusion. No definite acute process.   Electronically Signed   By: Rubye Oaks M.D.   On: 08/28/2014 06:34    EKG: Junctional rhythm with ventricular escape and now oral sinus rhythm with bundle branch block  Weights: Filed Weights   08/28/14 0602 08/28/14 0647  Weight: 152 lb (68.947 kg) 145 lb (65.772 kg)     Physical Exam: Blood pressure 156/63, pulse 66, temperature 97.4 F (36.3 C), temperature source Axillary, resp. rate 28, height 5' (1.524 m), weight 145 lb (65.772 kg), SpO2 98 %. Body mass index is 28.32 kg/(m^2). General: Well developed, well nourished, in no acute distress. Head  eyes ears nose throat: Normocephalic, atraumatic, sclera non-icteric, no xanthomas, nares are without discharge. No apparent thyromegaly and/or mass  Lungs: Normal respiratory effort.  no wheezes, no rales, no rhonchi.  Heart: Irregular rate with normal S1 S2. no murmur gallop, no rub, PMI is normal size and placement, carotid upstroke normal without bruit, jugular venous pressure is normal Abdomen: Soft, non-tender, non-distended with normoactive bowel sounds. No hepatomegaly. No rebound/guarding. No obvious abdominal masses. Abdominal aorta is normal size without bruit Extremities: Trace edema. no cyanosis, no clubbing, no ulcers  Peripheral : 2+ bilateral upper extremity pulses, 2+ bilateral femoral pulses, 2+ bilateral dorsal pedal pulse Neuro: Alert and oriented. No facial asymmetry. No focal deficit. Moves all extremities spontaneously. Musculoskeletal: Normal muscle tone with kyphosis Psych:  Responds to questions appropriately with a normal affect.    Assessment: 81 year old female with moderate to severe aortic valve stenosis paroxysmal nonvalvular atrial fibrillation with acute bradycardia likely secondary to elevated digoxin levels hyperkalemia and renal failure with current medical regimen  slowly improving with appropriate medication management. Patient and family wish not to have any invasive procedures if possible and will treat medically as below  Plan: 1. Dopamine for heart rate improvements and blood pressure support 2. Discontinue warfarin due to concerns of possible bleeding complication and need for invasive procedures in the near future 3. The cardiograph for LV systolic dysfunction valvular heart disease and significance of aortic valve stenosis 4. Hydration for renal failure and hypotension 5. Digibind for elevated digoxin levels and bradycardia 6. Treatment of hyperkalemia and renal failure may improve above 7. Further treatment options after  above  Signed, Lamar Blinks M.D. [facility] 08/28/2014, 8:18 AM

## 2014-08-28 NOTE — ED Provider Notes (Signed)
----------------------------------------- 6:15 AM on 08/28/2014 -----------------------------------------  Discussed with Dr. Arnoldo Hooker. Reviewed medication list and EKG and symptoms. We will empirically treat with Digibind, IV fluid hydration and Dr. Gwen Pounds is in route for emergent consultation and possible need for transcutaneous pacemaker.  Discussed with Ermalene Postin [facility]) who affirms patient's wishes are to be DNR and DNI. Patient would NOT want to have heart restarted or to be on a ventilator even if it saved her life. Based on discussion with the patient and health care power of attorney I'm confident the patient is DO NOT RESUSCITATE.  [facility] Emergency Department Provider Note  ____________________________________________  Time seen: Approximately 6:16 AM  I have reviewed the triage vital signs and the nursing notes.   HISTORY  Chief Complaint Respiratory Distress and Weakness  EM caveat: History examination and review of systems Limited due to high acuity and extreme bradycardia requiring immediate evaluation for emergent conditions  HPI Claudia Warren is a 81 y.o. female who presents today with nausea. She is been feeling weak and nauseated and slightly short of breath for about a day and it seemed to worsen suddenly around 4 AM. She is not having any chest pain, does feel slightly short of breath though. She has noticed that her legs are slightly swollen and a little red and believes that she may have symptoms from eating too many tomatoes with salt. She is not aware of overdosing on any of her medications, no recent medication changes, she is on Coumadin for atrial fibrillation as well as digoxin and metoprolol on review of medications.   No past medical history on file. Atrial fibrillation Hypertension There are no active problems to display for this patient.   No past surgical history on file.  No current outpatient prescriptions  on file.  Allergies Sulfa antibiotics and Penicillins  No family history on file.  Social History History  Substance Use Topics  . Smoking status: Never Smoker   . Smokeless tobacco: Never Used  . Alcohol Use: No    Review of Systems Constitutional: No fever/chills  Cardiovascular: Denies chest pain. Respiratory: see history of present illness Gastrointestinal: No abdominal pain.  Some nausea no vomiting. No diarrhea.  No constipation. Musculoskeletal: Negative for back pain. Skin: Negative for rash. Neurological: Negative for headaches, focal weakness or numbness.  10-point ROS otherwise negative.  ____________________________________________   PHYSICAL EXAM:  VITAL SIGNS: ED Triage Vitals  Enc Vitals Group     BP --      Pulse Rate 08/28/14 0602 31     Resp 08/28/14 0602 16     Temp --      Temp Source 08/28/14 0602 Oral     SpO2 08/28/14 0602 93 %     Weight 08/28/14 0602 45 lb (20.412 kg)     Height 08/28/14 0602 5' (1.524 m)     Head Cir --      Peak Flow --      Pain Score 08/28/14 0605 0     Pain Loc --      Pain Edu? --      Excl. in GC? --    Blood pressure 102 systolic bimanual.   Constitutional: Alert and oriented. Pale appearing and very fatigued. Eyes: Conjunctivae are normal. PERRL. EOMI. Head: Atraumatic. Nose: No congestion/rhinnorhea. Mouth/Throat: Mucous membranes are moist.  Oropharynx non-erythematous. Neck: No stridor.   Cardiovascular: extreme bradycardia Grossly normal heart sounds.  Cold peripherally with slow capillary refill in all extremities  Respiratory: Normal respiratory effort.  No retractions. Lungs CTAB.no wheezes or rales. Gastrointestinal: Soft and nontender. No distention. No abdominal bruits. No CVA tenderness. Musculoskeletal: 2+ pitting lower extremity edema bilaterally  No joint effusions. Neurologic:  Normal speech and language. No gross focal neurologic deficits are appreciated.Skin:  Skin is clinical intact.  Erythema over both Shins Psychiatric: Mood and affect are normal. Speech and behavior are normal.  ____________________________________________   LABS (all labs ordered are listed, but only abnormal results are displayed)  Labs Reviewed  CBC - Abnormal; Notable for the following:    Hemoglobin 10.3 (*)    HCT 33.0 (*)    MCV 78.5 (*)    MCH 24.5 (*)    MCHC 31.2 (*)    RDW 19.2 (*)    All other components within normal limits  BASIC METABOLIC PANEL - Abnormal; Notable for the following:    Potassium 7.0 (*)    CO2 21 (*)    Glucose, Bld 135 (*)    BUN 42 (*)    Creatinine, Ser 1.83 (*)    GFR calc non Af Amer 22 (*)    GFR calc Af Amer 26 (*)    All other components within normal limits  TROPONIN I - Abnormal; Notable for the following:    Troponin I 0.04 (*)    All other components within normal limits  PROTIME-INR - Abnormal; Notable for the following:    Prothrombin Time 30.7 (*)    All other components within normal limits  MAGNESIUM  DIGOXIN LEVEL   ____________________________________________  EKG  Reviewed and interpreted by me and discussed with Dr. Arnoldo Hooker at 6:10 AM EKG demonstrates extreme junctional bradycardia with a heart rate of 27 QRS is narrow at 84, there are significant ST depressions without ST elevation noted in multiple leads, these could be indicative of ischemic change however may also be indicative of junctional block. Third-degree heart block. Ventral rate 27 QRS 84 QTc 300 Interpreted as extreme junctional bradycardia with ischemic abnormalities, but no evidence of ST elevation  ____________________________________________  RADIOLOGY  DG Chest Port 1 View (Final result) Result time: 08/28/14 06:34:14   Final result by Rad Results In Interface (08/28/14 06:34:14)   Narrative:   CLINICAL DATA: Awoke with weakness and shortness of breath.  EXAM: PORTABLE CHEST - 1 VIEW  COMPARISON: Frontal and lateral views  03/13/2014  FINDINGS: Cardiomegaly and atherosclerotic periaortic, unchanged. Mild prominent central vascular congestion. Blunting of right costophrenic angle is unchanged. No consolidation. No pneumothorax. Degenerative changes of both shoulders.  IMPRESSION: Stable cardiomegaly, prominent pulmonary vasculature, and small right pleural effusion. No definite acute process.    ____________________________________________   PROCEDURES  Procedure(s) performed: None  Critical Care performed: Yes, see critical care note(s)  CRITICAL CARE Performed by: Sharyn Creamer   Total critical care time: 60  Critical care time was exclusive of separately billable procedures and treating other patients.  Critical care was necessary to treat or prevent imminent or life-threatening deterioration.  Critical care was time spent personally by me on the following activities: development of treatment plan with patient and/or surrogate as well as nursing, discussions with consultants, evaluation of patient's response to treatment, examination of patient, obtaining history from patient or surrogate, ordering and performing treatments and interventions, ordering and review of laboratory studies, ordering and review of radiographic studies, pulse oximetry and re-evaluation of patient's condition.  ____________________________________________   INITIAL IMPRESSION / ASSESSMENT AND PLAN / ED COURSE  Pertinent labs & imaging results  that were available during my care of the patient were reviewed by me and considered in my medical decision making (see chart for details).  Patient presents with extreme bradycardia. Associated nausea. She is however mentating normally and is not hypotensive and no chest pain and she does feel slightly short of breath. She does have notable edema in the lower extremities. Medication review indicates she is on digoxin and given what appears to be possible extreme junctional  bradycardia with possible underlying atrial fibrillation versus third degree heart block I will empirically treat her with Digibind.  ----------------------------------------- 6:29 AM on 08/28/2014 -----------------------------------------  Patient remains awake and alert, but did have a brief episode of a an extended by. I have discussed with pharmacy and emergently requested Digibind be sent up, in addition we will start dopamine infusion to correct bradycardia. I discussed with the patient the risks and benefits of central line placement for ongoing dopamine infusion, and she does not wish for this to be done rather we will continue dopamine on peripheral line with discussion and understanding that this is not the typical route, and it may cause injury to the skin and possible irreversible damage to the arm, but given her wishes and being DO NOT RESUSCITATE I find this reasonable and will initiate dopamine. At present she is awake alert and her blood pressure is over 100 systolic, I do not feel that she needs immediate transcutaneous pacing however pacing leads are on and Dr. Philemon Kingdom in route for emergent cardiology consultation for possible need to have a transvenous pacer placed. Him empirically treating her for possible digoxin overdose that we do not have a dig level back yet, in addition she has a metoprolol but we will withhold on glucagon at this time is a do not wish to induce vomiting.  ----------------------------------------- 7:12 AM on 08/28/2014 -----------------------------------------  Patient seen and affect are improving on dopamine infusion and with treatment for hyperkalemia. She is stabilizing, but still obviously in critical condition. Dr. Philemon Kingdom currently seeing her at bedside. Discussed with nephrology and initiate treatments for hyperkalemia, discussed with hospitalist service Dr. Sheryle Hail and patient will be admitted shortly.  Care and disposition assigned to Dr.  Carollee Massed at 7:10 AM. Continue to stabilize, follow up on ongoing treatments for hyperkalemia and extreme bradycardia. Patient is DO NOT RESUSCITATE. ____________________________________________   FINAL CLINICAL IMPRESSION(S) / ED DIAGNOSES  Final diagnoses:  Third degree heart block  Acute hyperkalemia  third degree heart block, acute Hyperkalemia, severe     Sharyn Creamer, MD 08/28/14 [phone]

## 2014-08-28 NOTE — Consult Note (Signed)
[facility]  CONSULT NOTE    Date: 08/28/2014                  Patient Name:  Claudia Warren  MRN: [id number]  DOB: [birth year]  Age / Sex: 81 y.o., female         PCP: No primary care provider on file.                 Service Requesting Consult: Dr. Fanny Bien                 Reason for Consult: Acute Renal Failure with hyperkalemia            History of Present Illness: [name] is a 81 y.o. white female with congestive heart failure, atrial fibrillation, aortic stenosis, anxiety, hypertension, meniere's disease and hypothyroidism who was admitted to [facility] on 08/28/2014 for bradycardia Patient was found to be weak, tired and having difficulty breathing. Presented to [facility] ED with nausea, weakness, shortness of breath. She states she was taking all her medications as prescribed. On labs, potassium of 7. She was then treated with calcium chloride and then started on a bicarb gtt. However patient with increasing peripheral edema and increasing work of breathing. Placed on BiPAp. Transferred to ICU. Started on dopamine Niece who is HCPOA is at bedside.  Patient states she is not interested in dialysis.    Medications: Outpatient medications: Prescriptions prior to admission  Medication Sig Dispense Refill Last Dose  . ALPRAZolam (XANAX) 0.25 MG tablet Take 1 tablet by mouth every morning.   08/27/2014 at Unknown time  . DIGOX 125 MCG tablet Take 1 tablet by mouth daily.   08/27/2014 at Unknown time  . furosemide (LASIX) 40 MG tablet Take 1 tablet by mouth daily.   08/27/2014 at Unknown time  . levothyroxine (SYNTHROID, LEVOTHROID) 75 MCG tablet Take 1 tablet by mouth daily.   08/27/2014 at Unknown time  . loratadine (CLARITIN) 10 MG tablet Take 10 mg by mouth daily.   08/27/2014 at Unknown time  . metoprolol tartrate (LOPRESSOR) 25 MG tablet Take 1 tablet by mouth 3 (three) times daily.   08/27/2014 at Unknown time  . omeprazole (PRILOSEC) 20 MG capsule Take 1  capsule by mouth every morning.   08/27/2014 at Unknown time  . potassium chloride SA (K-DUR,KLOR-CON) 20 MEQ tablet Take 1 tablet by mouth daily.   08/27/2014 at Unknown time  . pramipexole (MIRAPEX) 0.75 MG tablet Take 0.75 mg by mouth every evening.   08/27/2014 at Unknown time  . simvastatin (ZOCOR) 20 MG tablet Take 1 tablet by mouth daily.   08/27/2014 at Unknown time  . spironolactone (ALDACTONE) 25 MG tablet Take 1 tablet by mouth daily.   08/27/2014 at Unknown time  . warfarin (COUMADIN) 3 MG tablet Take 1 tablet by mouth daily.   08/27/2014 at Unknown time    Current medications: Current Facility-Administered Medications  Medication Dose Route Frequency Provider Last Rate Last Dose  . acetaminophen (TYLENOL) tablet 650 mg  650 mg Oral Q6H PRN Alford Highland, MD       Or  . acetaminophen (TYLENOL) suppository 650 mg  650 mg Rectal Q6H PRN Alford Highland, MD      . ALPRAZolam Prudy Feeler) tablet 0.25 mg  0.25 mg Oral BH-q7a Richard Rose Lodge, MD      . cephALEXin [facility]) capsule 250 mg  250 mg Oral Q12H Alford Highland, MD      . levothyroxine (SYNTHROID,  LEVOTHROID) tablet 75 mcg  75 mcg Oral BH-q7a Alford Highland, MD      . loratadine (CLARITIN) tablet 10 mg  10 mg Oral Daily Alford Highland, MD      . pantoprazole (PROTONIX) EC tablet 40 mg  40 mg Oral Daily Alford Highland, MD      . polyethylene glycol (MIRALAX / GLYCOLAX) packet 17 g  17 g Oral Daily PRN Alford Highland, MD      . pramipexole (MIRAPEX) tablet 0.75 mg  0.75 mg Oral QPM Alford Highland, MD      . simvastatin (ZOCOR) tablet 20 mg  20 mg Oral Daily Alford Highland, MD      . sodium chloride 0.9 % injection 3 mL  3 mL Intravenous Q12H Alford Highland, MD      . warfarin (COUMADIN) tablet 3 mg  3 mg Oral q1800 Alford Highland, MD      . Warfarin - Physician Dosing Inpatient   Does not apply [id number] Alford Highland, MD          Allergies: Allergies  Allergen Reactions  . Sulfa Antibiotics   . Penicillins Rash       Past Medical History: Past Medical History  Diagnosis Date  . CHF (congestive heart failure)   . Hypertension   . Anxiety   . Hypothyroidism   . Atrial fibrillation   . Edema   . Restless leg   . Meniere disease      Past Surgical History: Past Surgical History  Procedure Laterality Date  . Abdominal hysterectomy    . Cholecystectomy    . Cataract extraction N/A   . Thyroid surgery N/A      Family History: Family History  Problem Relation Age of Onset  . CVA Mother   . CAD Father   . CAD Sister   . CAD Brother      Social History: History   Social History  . Marital Status: Widowed    Spouse Name: N/A  . Number of Children: N/A  . Years of Education: N/A   Occupational History  . Not on file.   Social History Main Topics  . Smoking status: Never Smoker   . Smokeless tobacco: Never Used  . Alcohol Use: No  . Drug Use: No  . Sexual Activity: No   Other Topics Concern  . Not on file   Social History Narrative  . No narrative on file     Review of Systems: Review of Systems  Unable to perform ROS: critical illness    Vital Signs: Blood pressure 156/63, pulse 78, temperature 97.4 F (36.3 C), temperature source Axillary, resp. rate 27, height 5' (1.524 m), weight 65.772 kg (145 lb), SpO2 100 %.  Weight trends: Filed Weights   08/28/14 0602 08/28/14 0647  Weight: 68.947 kg (152 lb) 65.772 kg (145 lb)    Physical Exam: General: Critically ill, in respiratory distress  Head: +BiPaP  Eyes: Anicteric, PERRL  Neck: Supple, trachea midline  Lungs:  Bilateral crackles  Heart: Irregular, bradycardia  Abdomen:  Soft, nontender,   Extremities:  ++ peripheral edema.  Neurologic: Nonfocal, moving all four extremities  Skin: No lesions        Lab results: Basic Metabolic Panel:  Recent Labs Lab 08/28/14 0612  NA 135  K 7.0*  CL 104  CO2 21*  GLUCOSE 135*  BUN 42*  CREATININE 1.83*  CALCIUM 9.9  MG 2.2    Liver Function  Tests: No results for input(s): AST,  ALT, ALKPHOS, BILITOT, PROT, ALBUMIN in the last 168 hours. No results for input(s): LIPASE, AMYLASE in the last 168 hours. No results for input(s): AMMONIA in the last 168 hours.  CBC:  Recent Labs Lab 08/28/14 0612  WBC 6.8  HGB 10.3*  HCT 33.0*  MCV 78.5*  PLT 211    Cardiac Enzymes:  Recent Labs Lab 08/28/14 0612  TROPONINI 0.04*    BNP: Invalid input(s): POCBNP  CBG:  Recent Labs Lab 08/28/14 0840  GLUCAP 167*    Microbiology: No results found for this or any previous visit.  Coagulation Studies:  Recent Labs  08/28/14 0612  LABPROT 30.7*  INR 2.94    Urinalysis: No results for input(s): COLORURINE, LABSPEC, PHURINE, GLUCOSEU, HGBUR, BILIRUBINUR, KETONESUR, PROTEINUR, UROBILINOGEN, NITRITE, LEUKOCYTESUR in the last 72 hours.  Invalid input(s): APPERANCEUR    Imaging: Dg Chest Port 1 View  08/28/2014   CLINICAL DATA:  Awoke with weakness and shortness of breath.  EXAM: PORTABLE CHEST - 1 VIEW  COMPARISON:  Frontal and lateral views 03/13/2014  FINDINGS: Cardiomegaly and atherosclerotic periaortic, unchanged. Mild prominent central vascular congestion. Blunting of right costophrenic angle is unchanged. No consolidation. No pneumothorax. Degenerative changes of both shoulders.  IMPRESSION: Stable cardiomegaly, prominent pulmonary vasculature, and small right pleural effusion. No definite acute process.   Electronically Signed   By: Rubye Oaks M.D.   On: 08/28/2014 06:34      Assessment & Plan: Claudia Warren is a 81 y.o. white female withwith congestive heart failure, atrial fibrillation, aortic stenosis, anxiety, hypertension, meniere's disease and hypothyroidism who was admitted to [facility] on 08/28/2014 for bradycardia  1. Acute renal failure with hyperkalemia: on chronic kidney disease stage III with baseline creatinine of 1. Acute renal failure and hyperkalemia seem to be secondary to acute exacerbation  of congestive heart failure.  Hyperkalemia secondary to oral potassium supplements along with spironolactone.  Nonoliguric urine output - Status post D50, calcium and bicarb infusion.  - Recheck renal function  - Patient does not want dialysis.   2. Acute exacerbation of congestive heart failure with bradycardia and heart block: appreciate cardiology input. Outpatient cardiologist is Dr. Juel Burrow. Echocardiogram pending - May need IV diuretics.  - repeat chest x-ary.    LOS: 0 Claudia Warren 7/29/201610:11 AM

## 2014-08-28 NOTE — Assessment & Plan Note (Addendum)
-  With pulmonary edema , now doing better

## 2014-08-28 NOTE — Progress Notes (Signed)
Work of breathing has improved but still labored and on bipap. o2 sats upper 90's. Expiratory wheezes noted to left upper lobe. Dopamine drip increased due to bradycardia in 50's. Continuing to monitor.

## 2014-08-28 NOTE — Consult Note (Signed)
[facility] Consultation   Thank you, Dr. Hilton Sinclair for this kind referral, here is my impression.   ASSESSMENT / PLAN: Volume overload -With pulmonary edema the patient was given 60 mg of IV Lasix this a.m. and we will monitor her progress.  Symptomatic bradycardia -Likely due to digoxin toxicity, now appears to be improved we'll continue to monitor  Respiratory failure -Due to pulmonary edema as above will continue BiPAP which appears to have improved her breathing.  Pulmonary edema -Will continue to monitor the patient appears to be improved after receiving Lasix and being put on BiPAP. -This is likely multifactorial from acute renal failure as well as severe aortic stenosis.  Hyperkalemia -Will draw serial potassiums to monitor.  Digitalis toxicity -The patient is artery been given Digibind. -We'll continue to monitor levels.  Aortic stenosis -Severe aortic stenosis.  Acute renal failure -The patient and family have opted for DO NOT RESUSCITATE status without dialysis. -Therefore we're managing conservatively, will continue to monitor her progress if her status does not improve will continue changing her status to comfort measures only.    ---------------------------------------   Name: Claudia Warren MRN: [id number] DOB: 27-Feb-1917    ADMISSION DATE:  08/28/2014 CONSULTATION DATE:  08/28/14  REFERRING MD :  Alford Highland M.D  CHIEF COMPLAINT:  Dyspnea   HISTORY OF PRESENT ILLNESS:   Claudia Warren is a 81 year old Caucasian female with a history significant for CHF hypertension atrial fibrillation. She presented to the hospital with progressive dyspnea shw was evaluated in the emergency room and it was found that she had some significant hyperkalemia as well as bradycardia. She appeared to be an acute digitalis toxicity likely related to acute renal failure. Cardiology and nephrology services was consulted the patient was started on dopamine  drip due to bradycardia she continued to have significant bradycardia with progressive heart failure and was subsequently transferred to the intensive care unit for further monitoring and treatment. The patient is currently DO NOT RESUSCITATE status upon arrival to the ICU was noted that she was in acute respiratory distress she was therefore given 60 mg of IV Lasix immediately placed on BiPAP with improvement in her symptoms. Subsequently further labs and studies are pending her chest x-ray appeared to be consistent with acute pulmonary edema.    PAST MEDICAL HISTORY :  Past Medical History  Diagnosis Date  . CHF (congestive heart failure)   . Hypertension   . Anxiety   . Hypothyroidism   . Atrial fibrillation   . Edema   . Restless leg   . Meniere disease    Past Surgical History  Procedure Laterality Date  . Abdominal hysterectomy    . Cholecystectomy    . Cataract extraction N/A   . Thyroid surgery N/A    Prior to Admission medications   Medication Sig Start Date End Date Taking? Authorizing Provider  ALPRAZolam Prudy Feeler) 0.25 MG tablet Take 1 tablet by mouth every morning. 06/12/14  Yes Historical Provider, MD  DIGOX 125 MCG tablet Take 1 tablet by mouth daily. 08/11/14  Yes Historical Provider, MD  furosemide (LASIX) 40 MG tablet Take 1 tablet by mouth daily. 08/11/14  Yes Historical Provider, MD  levothyroxine (SYNTHROID, LEVOTHROID) 75 MCG tablet Take 1 tablet by mouth daily. 08/11/14  Yes Historical Provider, MD  loratadine (CLARITIN) 10 MG tablet Take 10 mg by mouth daily.   Yes Historical Provider, MD  metoprolol tartrate (LOPRESSOR) 25 MG tablet Take 1 tablet by mouth 3 (three) times  daily. 08/11/14  Yes Historical Provider, MD  omeprazole (PRILOSEC) 20 MG capsule Take 1 capsule by mouth every morning. 08/13/14  Yes Historical Provider, MD  potassium chloride SA (K-DUR,KLOR-CON) 20 MEQ tablet Take 1 tablet by mouth daily. 08/11/14  Yes Historical Provider, MD  pramipexole  (MIRAPEX) 0.75 MG tablet Take 0.75 mg by mouth every evening.   Yes Historical Provider, MD  simvastatin (ZOCOR) 20 MG tablet Take 1 tablet by mouth daily. 08/11/14  Yes Historical Provider, MD  spironolactone (ALDACTONE) 25 MG tablet Take 1 tablet by mouth daily. 08/11/14  Yes Historical Provider, MD  warfarin (COUMADIN) 3 MG tablet Take 1 tablet by mouth daily. 08/11/14  Yes Historical Provider, MD   Allergies  Allergen Reactions  . Sulfa Antibiotics   . Penicillins Rash    FAMILY HISTORY:  Family History  Problem Relation Age of Onset  . CVA Mother   . CAD Father   . CAD Sister   . CAD Brother    SOCIAL HISTORY:  reports that she has never smoked. She has never used smokeless tobacco. She reports that she does not drink alcohol or use illicit drugs.  REVIEW OF SYSTEMS:   Constitutional: Feels well. Cardiovascular: No chest pain.  Pulmonary: Denies dyspnea.   The remainder of systems were reviewed and were found to be negative other than what is documented in the HPI.    VITAL SIGNS: Temp:  [97.4 F (36.3 C)] 97.4 F (36.3 C) (07/29 0602) Pulse Rate:  [31-78] 62 (07/29 1000) Resp:  [16-28] 20 (07/29 1000) BP: (102-156)/(51-94) 136/68 mmHg (07/29 1000) SpO2:  [78 %-100 %] 98 % (07/29 1000) Weight:  [65.772 kg (145 lb)-68.947 kg (152 lb)] 65.772 kg (145 lb) (07/29 0647) HEMODYNAMICS:   VENTILATOR SETTINGS:   INTAKE / OUTPUT:  Intake/Output Summary (Last 24 hours) at 08/28/14 1136 Last data filed at 08/28/14 0900  Gross per 24 hour  Intake      0 ml  Output    500 ml  Net   -500 ml    Physical Examination:   VS: BP 136/68 mmHg  Pulse 62  Temp(Src) 97.4 F (36.3 C) (Axillary)  Resp 20  Ht 5' (1.524 m)  Wt 65.772 kg (145 lb)  BMI 28.32 kg/m2  SpO2 98%  General Appearance: No distress  Neuro:without focal findings, mental status, speech normal, alert and oriented, cranial nerves 2-12 intact, reflexes normal and symmetric, sensation grossly normal  HEENT:  PERRLA, EOM intact. Pulmonary: Bilateral crackles.  CardiovascularNormal S1,S2.  No m/r/g.  Abdominal aorta pulsation normal.    Abdomen: Benign, Soft, non-tender, No masses, hepatosplenomegaly, No lymphadenopathy Renal:  No costovertebral tenderness  GU:  No performed at this time. Endoc: No evident thyromegaly, no signs of acromegaly or Cushing features Skin:   warm, no rashes, no ecchymosis  Extremities: normal, no cyanosis, clubbing, no edema, warm with normal capillary refill.    LABS:  CBC  Recent Labs Lab 08/28/14 0612  WBC 6.8  HGB 10.3*  HCT 33.0*  PLT 211   Coag's  Recent Labs Lab 08/28/14 0612  INR 2.94   BMET  Recent Labs Lab 08/28/14 0612  NA 135  K 7.0*  CL 104  CO2 21*  BUN 42*  CREATININE 1.83*  GLUCOSE 135*   Electrolytes  Recent Labs Lab 08/28/14 0612  CALCIUM 9.9  MG 2.2   Sepsis Markers No results for input(s): LATICACIDVEN, PROCALCITON, O2SATVEN in the last 168 hours. ABG No results for input(s): PHART, PCO2ART, PO2ART  in the last 168 hours. Liver Enzymes No results for input(s): AST, ALT, ALKPHOS, BILITOT, ALBUMIN in the last 168 hours. Cardiac Enzymes  Recent Labs Lab 08/28/14 0612  TROPONINI 0.04*   Glucose  Recent Labs Lab 08/28/14 0840  GLUCAP 167*    Imaging Dg Chest Port 1 View  08/28/2014   CLINICAL DATA:  Respiratory failure. Bradycardia, renal failure and congestive heart failure.  EXAM: PORTABLE CHEST - 1 VIEW  COMPARISON:  Film earlier today at 0600 hr.  FINDINGS: Worsening congestive heart failure present with more overt pulmonary edema now present. No significant pleural effusions. Stable moderate cardiomegaly. Pacing pads remain overlying the chest. No pneumothorax.  IMPRESSION: Worsening CHF.   Electronically Signed   By: Irish Lack M.D.   On: 08/28/2014 10:45   Dg Chest Port 1 View  08/28/2014   CLINICAL DATA:  Awoke with weakness and shortness of breath.  EXAM: PORTABLE CHEST - 1 VIEW  COMPARISON:   Frontal and lateral views 03/13/2014  FINDINGS: Cardiomegaly and atherosclerotic periaortic, unchanged. Mild prominent central vascular congestion. Blunting of right costophrenic angle is unchanged. No consolidation. No pneumothorax. Degenerative changes of both shoulders.  IMPRESSION: Stable cardiomegaly, prominent pulmonary vasculature, and small right pleural effusion. No definite acute process.   Electronically Signed   By: Rubye Oaks M.D.   On: 08/28/2014 06:34    STUDIES:  CXR report and images reviewed these appear to be consistent with pulmonary edema and age-related emphysematous changes.  --Wells Guiles, MD.  Board Certified in Internal Medicine, Pulmonary Medicine, Critical Care Medicine, and Sleep Medicine.  Pager [phone] [facility] Office Number: [phone]  Santiago Glad, M.D.  Stephanie Acre, M.D.  Carolyne Fiscal, M.D   08/28/2014, 11:36 AM  Critical Care Attestation.  I have personally obtained a history, examined the patient, evaluated laboratory and imaging results, formulated the assessment and plan and placed orders. The Patient requires high complexity decision making for assessment and support, frequent evaluation and titration of therapies, application of advanced monitoring technologies and extensive interpretation of multiple databases. The patient has critical illness that could lead imminently to failure of 1 or more organ systems and requires the highest level of physician preparedness to intervene.  Critical Care Time devoted to patient care services described in this note is 35 minutes and is exclusive of time spent in procedures.

## 2014-08-28 NOTE — Assessment & Plan Note (Addendum)
-  The patient has already been given Digibind. -Will continue to monitor levels.

## 2014-08-28 NOTE — Assessment & Plan Note (Addendum)
-  Likely due to digoxin toxicity, now appears to be improved . -The patient continues on a dopamine drip at a low dose. Will continue to monitor

## 2014-08-28 NOTE — Progress Notes (Signed)
Patient just arrived from ED, working to breathe with audible crackles and restless. Dr. Renae Gloss paged and called unit back and RN made MD aware that patient is working to breath with audible crackles.  Dr. Renae Gloss stated "Stop the bicarb drip and stop the saline." no further orders. Dr. Ricky Stabs arrived at bedside and assessed patient and gave order for bipap and  of IV lasix along with to insert foley."

## 2014-08-29 ENCOUNTER — Inpatient Hospital Stay: Payer: Medicare Other

## 2014-08-29 DIAGNOSIS — I4891 Unspecified atrial fibrillation: Secondary | ICD-10-CM

## 2014-08-29 DIAGNOSIS — E875 Hyperkalemia: Secondary | ICD-10-CM

## 2014-08-29 DIAGNOSIS — G2581 Restless legs syndrome: Secondary | ICD-10-CM

## 2014-08-29 DIAGNOSIS — R0600 Dyspnea, unspecified: Secondary | ICD-10-CM

## 2014-08-29 DIAGNOSIS — E039 Hypothyroidism, unspecified: Secondary | ICD-10-CM

## 2014-08-29 DIAGNOSIS — R609 Edema, unspecified: Secondary | ICD-10-CM

## 2014-08-29 DIAGNOSIS — N179 Acute kidney failure, unspecified: Secondary | ICD-10-CM

## 2014-08-29 DIAGNOSIS — D649 Anemia, unspecified: Secondary | ICD-10-CM

## 2014-08-29 DIAGNOSIS — T460X5S Adverse effect of cardiac-stimulant glycosides and drugs of similar action, sequela: Secondary | ICD-10-CM

## 2014-08-29 DIAGNOSIS — T50905S Adverse effect of unspecified drugs, medicaments and biological substances, sequela: Secondary | ICD-10-CM

## 2014-08-29 DIAGNOSIS — I442 Atrioventricular block, complete: Secondary | ICD-10-CM | POA: Insufficient documentation

## 2014-08-29 DIAGNOSIS — F419 Anxiety disorder, unspecified: Secondary | ICD-10-CM

## 2014-08-29 DIAGNOSIS — I340 Nonrheumatic mitral (valve) insufficiency: Secondary | ICD-10-CM

## 2014-08-29 DIAGNOSIS — R001 Bradycardia, unspecified: Secondary | ICD-10-CM

## 2014-08-29 DIAGNOSIS — I509 Heart failure, unspecified: Secondary | ICD-10-CM

## 2014-08-29 DIAGNOSIS — Z515 Encounter for palliative care: Secondary | ICD-10-CM

## 2014-08-29 DIAGNOSIS — I10 Essential (primary) hypertension: Secondary | ICD-10-CM

## 2014-08-29 DIAGNOSIS — I350 Nonrheumatic aortic (valve) stenosis: Secondary | ICD-10-CM

## 2014-08-29 DIAGNOSIS — H8109 Meniere's disease, unspecified ear: Secondary | ICD-10-CM

## 2014-08-29 LAB — DIGOXIN LEVEL: DIGOXIN LVL: 3.4 ng/mL — AB (ref 0.8–2.0)

## 2014-08-29 LAB — BASIC METABOLIC PANEL
Anion gap: 10 (ref 5–15)
BUN: 36 mg/dL — ABNORMAL HIGH (ref 6–20)
CO2: 28 mmol/L (ref 22–32)
Calcium: 9.7 mg/dL (ref 8.9–10.3)
Chloride: 100 mmol/L — ABNORMAL LOW (ref 101–111)
Creatinine, Ser: 1.6 mg/dL — ABNORMAL HIGH (ref 0.44–1.00)
GFR calc Af Amer: 30 mL/min — ABNORMAL LOW (ref >60)
GFR, EST NON AFRICAN AMERICAN: 26 mL/min — AB (ref >60)
GLUCOSE: 111 mg/dL — AB (ref 65–99)
POTASSIUM: 4.3 mmol/L (ref 3.5–5.1)
SODIUM: 138 mmol/L (ref 135–145)

## 2014-08-29 LAB — CBC
HEMATOCRIT: 30.2 % — AB (ref 35.0–47.0)
HEMOGLOBIN: 9.3 g/dL — AB (ref 12.0–16.0)
MCH: 23.6 pg — AB (ref 26.0–34.0)
MCHC: 30.6 g/dL — AB (ref 32.0–36.0)
MCV: 77.1 fL — ABNORMAL LOW (ref 80.0–100.0)
PLATELETS: 197 10*3/uL (ref 150–440)
RBC: 3.92 MIL/uL (ref 3.80–5.20)
RDW: 19.2 % — ABNORMAL HIGH (ref 11.5–14.5)
WBC: 11.6 10*3/uL — ABNORMAL HIGH (ref 3.6–11.0)

## 2014-08-29 LAB — PROTIME-INR
INR: 3.35
Prothrombin Time: 34 seconds — ABNORMAL HIGH (ref 11.4–15.0)

## 2014-08-29 MED ORDER — FUROSEMIDE 10 MG/ML IJ SOLN
20.0000 mg | Freq: Two times a day (BID) | INTRAMUSCULAR | Status: DC
  Administered 2014-08-29 – 2014-09-01 (×7): 20 mg via INTRAVENOUS
  Filled 2014-08-29 (×8): qty 2

## 2014-08-29 NOTE — Progress Notes (Signed)
Dr. Gwen Pounds present and RN made him aware that patient just had a run of PATs. MD acknowledged and gave no new orders.

## 2014-08-29 NOTE — Consult Note (Signed)
[facility] progress note  ASSESSMENT / PLAN: Volume overload -With pulmonary edema , now doing better  Symptomatic bradycardia -Likely due to digoxin toxicity, now appears to be improved . -The patient continues on a dopamine drip at a low dose. Will continue to monitor  Respiratory failure -The patient is now doing better. She has been weaned off BiPAP and is currently on a nasal cannula.  Pulmonary edema -Will continue to monitor the patient appears to be improved. -This is likely multifactorial from acute renal failure as well as severe aortic stenosis.  Hyperkalemia -monitor.  Digitalis toxicity -The patient has already been given Digibind. -Will continue to monitor levels.  Aortic stenosis -Severe aortic stenosis.  Acute renal failure -The patient and family have opted for DO NOT RESUSCITATE status without dialysis. -Therefore we're managing conservatively, will continue to monitor her progress if her status does not improve will continue changing her status to comfort measures only.     ---------------------------------------   Name: Claudia Warren MRN: [id number] DOB: Jul 15, 1917    ADMISSION DATE:  08/28/2014 CONSULTATION DATE:  08/28/14  REFERRING MD :  Alford Highland M.D  CHIEF COMPLAINT:  Dyspnea   HISTORY OF PRESENT ILLNESS:   The patient is awake and alert today. She appears to be much improved  FAMILY HISTORY:  Family History  Problem Relation Age of Onset  . CVA Mother   . CAD Father   . CAD Sister   . CAD Brother    REVIEW OF SYSTEMS:   Constitutional: Feels well. Cardiovascular: No chest pain.  Pulmonary: Denies dyspnea.   The remainder of systems were reviewed and were found to be negative other than what is documented in the HPI.    VITAL SIGNS: Temp:  [97.7 F (36.5 C)-98.6 F (37 C)] 98.1 F (36.7 C) (07/30 0714) Pulse Rate:  [39-118] 78 (07/30 1046) Resp:  [15-27] 27 (07/30 1046) BP:  (73-137)/(27-117) 108/55 mmHg (07/30 1046) SpO2:  [79 %-100 %] 92 % (07/30 1046) FiO2 (%):  [45 %] 45 % (07/30 0015) HEMODYNAMICS:   VENTILATOR SETTINGS: Vent Mode:  [-]  FiO2 (%):  [45 %] 45 % INTAKE / OUTPUT:  Intake/Output Summary (Last 24 hours) at 08/29/14 1106 Last data filed at 08/29/14 1049  Gross per 24 hour  Intake  955.3 ml  Output   1625 ml  Net -669.7 ml    Physical Examination:   VS: BP 108/55 mmHg  Pulse 78  Temp(Src) 98.1 F (36.7 C) (Oral)  Resp 27  Ht 5' (1.524 m)  Wt 65.772 kg (145 lb)  BMI 28.32 kg/m2  SpO2 92%  General Appearance: No distress  Neuro:without focal findings, mental status, speech normal, alert and oriented, cranial nerves 2-12 intact, reflexes normal and symmetric, sensation grossly normal  HEENT: PERRLA, EOM intact. Pulmonary: Bilateral crackles.  CardiovascularNormal S1,S2.  No m/r/g.  Abdomen: Benign, Soft, non-tender. Renal:  No costovertebral tenderness  GU:  No performed at this time. Endoc: No evident thyromegaly, Skin:   warm, no rashes, no ecchymosis  Extremities: normal, no cyanosis, clubbing.   LABS:  CBC  Recent Labs Lab 08/28/14 0612 08/29/14 0632  WBC 6.8 11.6*  HGB 10.3* 9.3*  HCT 33.0* 30.2*  PLT 211 197   Coag's  Recent Labs Lab 08/28/14 0612 08/29/14 0632  INR 2.94 3.35   BMET  Recent Labs Lab 08/28/14 0612 08/28/14 1223 08/29/14 0632  NA 135 138 138  K 7.0* 5.0 4.3  CL 104 103 100*  CO2 21* 27 28  BUN 42* 43* 36*  CREATININE 1.83* 1.66* 1.60*  GLUCOSE 135* 103* 111*   Electrolytes  Recent Labs Lab 08/28/14 0612 08/28/14 1223 08/29/14 0632  CALCIUM 9.9 11.2* 9.7  MG 2.2  --   --    Sepsis Markers No results for input(s): LATICACIDVEN, PROCALCITON, O2SATVEN in the last 168 hours. ABG No results for input(s): PHART, PCO2ART, PO2ART in the last 168 hours. Liver Enzymes No results for input(s): AST, ALT, ALKPHOS, BILITOT, ALBUMIN in the last 168 hours. Cardiac  Enzymes  Recent Labs Lab 08/28/14 0612  TROPONINI 0.04*   Glucose  Recent Labs Lab 08/28/14 0840  GLUCAP 167*    Imaging Dg Chest 1 View  08/29/2014   CLINICAL DATA:  81 year old female with a history of dyspnea.  EXAM: CHEST  1 VIEW  COMPARISON:  08/28/2014, 03/13/2014  FINDINGS: Cardiomediastinal silhouette unchanged. Atherosclerotic calcifications of the aortic arch.  Defibrillator pads remain on the chest.  Mixed interstitial and airspace disease, with prominence of the central vasculature. Similar interlobular septal thickening. Left base not well evaluated.  No pneumothorax.  IMPRESSION: Persisting evidence of congestive heart failure, with mildly worsened edema and atelectasis. Cannot rule out left basilar pleural effusion.  Unchanged position of defibrillator pads.  Atherosclerosis.  Signed,  Yvone Neu. Loreta Ave, DO  [facility]  [facility]   Electronically Signed   By: Gilmer Mor D.O.   On: 08/29/2014 08:44    STUDIES:  CXR report and images reviewed these appear to be consistent with pulmonary edema and age-related emphysematous changes.  --Wells Guiles, MD.  Board Certified in Internal Medicine, Pulmonary Medicine, Critical Care Medicine, and Sleep Medicine.  Pager [phone] [place] [facility] Office Number: [phone]  Santiago Glad, M.D.  Stephanie Acre, M.D.  Carolyne Fiscal, M.D   08/29/2014, 11:06 AM

## 2014-08-29 NOTE — Progress Notes (Signed)
[facility] - [place] at [facility]                                                                                                                                                                                            Patient Demographics   Claudia Warren, is a 81 y.o. female, DOB - Apr 06, 1917, JXB:[id number]  Admit date - 08/28/2014   Admitting Physician Alford Highland, MD  Outpatient Primary MD for the patient is No primary care provider on file.   LOS - 1  Subjective: Patient's feels better heart rate improved still on dopamine drip, still short of breath     Review of Systems:   CONSTITUTIONAL: No documented fever. No fatigue, weakness. No weight gain, no weight loss.  EYES: No blurry or double vision.  ENT: No tinnitus. No postnasal drip. No redness of the oropharynx.  RESPIRATORY: No cough, no wheeze, no hemoptysis. Positive dyspnea.  CARDIOVASCULAR: No chest pain. No orthopnea. No palpitations. No syncope.  GASTROINTESTINAL: No nausea, no vomiting or diarrhea. No abdominal pain. No melena or hematochezia.  GENITOURINARY: No dysuria or hematuria.  ENDOCRINE: No polyuria or nocturia. No heat or cold intolerance.  HEMATOLOGY: No anemia. No bruising. No bleeding.  INTEGUMENTARY: No rashes. No lesions.  MUSCULOSKELETAL: No arthritis. No swelling. No gout.  NEUROLOGIC: No numbness, tingling, or ataxia. No seizure-type activity.  PSYCHIATRIC: No anxiety. No insomnia. No ADD.    Vitals:   Filed Vitals:   08/29/14 1000 08/29/14 1046 08/29/14 1100 08/29/14 1130  BP: 117/59 108/55 102/67 116/55  Pulse: 72 78 74 79  Temp:      TempSrc:      Resp: 27 27 26 31   Height:      Weight:      SpO2: 97% 92% 95% 94%    Wt Readings from Last 3 Encounters:  08/28/14 65.772 kg (145 lb)     Intake/Output Summary (Last 24 hours) at 08/29/14 1249 Last data filed at 08/29/14 1049  Gross per 24 hour  Intake  945.4 ml  Output   1400 ml  Net -454.6 ml     Physical Exam:   GENERAL: Critically ill.  HEAD, EYES, EARS, NOSE AND THROAT: Atraumatic, normocephalic. Extraocular muscles are intact. Pupils equal and reactive to light. Sclerae anicteric. No conjunctival injection. No oro-pharyngeal erythema.  NECK: Supple. There is no jugular venous distention. No bruits, no lymphadenopathy, no thyromegaly.  HEART: Regular rate and rhythm, tachycardic. No murmurs, no rubs, no clicks.  LUNGS: Clear to auscultation bilaterally. No rales or rhonchi. No wheezes.  ABDOMEN: Soft, flat, nontender, nondistended. Has good bowel sounds. No  hepatosplenomegaly appreciated.  EXTREMITIES: No evidence of any cyanosis, clubbing, or peripheral edema.  +2 pedal and radial pulses bilaterally.  NEUROLOGIC: The patient is alert, awake, and oriented x3 with no focal motor or sensory deficits appreciated bilaterally.  SKIN: Moist and warm with no rashes appreciated.  Psych: Not anxious, depressed LN: No inguinal LN enlargement    Antibiotics   Anti-infectives    Start     Dose/Rate Route Frequency Ordered Stop   08/28/14 1000  cephALEXin (KEFLEX) capsule 250 mg     250 mg Oral Every 12 hours 08/28/14 0856        Medications   Scheduled Meds: . ALPRAZolam  0.25 mg Oral BH-q7a  . antiseptic oral rinse  7 mL Mouth Rinse BID  . cephALEXin  250 mg Oral Q12H  . furosemide  20 mg Intravenous Q12H  . levothyroxine  75 mcg Oral BH-q7a  . loratadine  10 mg Oral Daily  . pantoprazole  40 mg Oral Daily  . pramipexole  0.75 mg Oral QPM  . simvastatin  20 mg Oral Daily  . sodium chloride  3 mL Intravenous Q12H   Continuous Infusions:  PRN Meds:.acetaminophen **OR** acetaminophen, polyethylene glycol   Data Review:   Micro Results Recent Results (from the past 240 hour(s))  MRSA PCR Screening     Status: None   Collection Time: 08/28/14  9:14 AM  Result Value Ref Range Status   MRSA by PCR NEGATIVE NEGATIVE Final    Comment:        The GeneXpert MRSA Assay  (FDA approved for NASAL specimens only), is one component of a comprehensive MRSA colonization surveillance program. It is not intended to diagnose MRSA infection nor to guide or monitor treatment for MRSA infections.     Radiology Reports Dg Chest 1 View  08/29/2014   CLINICAL DATA:  81 year old female with a history of dyspnea.  EXAM: CHEST  1 VIEW  COMPARISON:  08/28/2014, 03/13/2014  FINDINGS: Cardiomediastinal silhouette unchanged. Atherosclerotic calcifications of the aortic arch.  Defibrillator pads remain on the chest.  Mixed interstitial and airspace disease, with prominence of the central vasculature. Similar interlobular septal thickening. Left base not well evaluated.  No pneumothorax.  IMPRESSION: Persisting evidence of congestive heart failure, with mildly worsened edema and atelectasis. Cannot rule out left basilar pleural effusion.  Unchanged position of defibrillator pads.  Atherosclerosis.  Signed,  Yvone Neu. Loreta Ave, DO  [facility]  [facility]   Electronically Signed   By: Gilmer Mor D.O.   On: 08/29/2014 08:44   Dg Chest Port 1 View  08/28/2014   CLINICAL DATA:  Respiratory failure. Bradycardia, renal failure and congestive heart failure.  EXAM: PORTABLE CHEST - 1 VIEW  COMPARISON:  Film earlier today at 0600 hr.  FINDINGS: Worsening congestive heart failure present with more overt pulmonary edema now present. No significant pleural effusions. Stable moderate cardiomegaly. Pacing pads remain overlying the chest. No pneumothorax.  IMPRESSION: Worsening CHF.   Electronically Signed   By: Irish Lack M.D.   On: 08/28/2014 10:45   Dg Chest Port 1 View  08/28/2014   CLINICAL DATA:  Awoke with weakness and shortness of breath.  EXAM: PORTABLE CHEST - 1 VIEW  COMPARISON:  Frontal and lateral views 03/13/2014  FINDINGS: Cardiomegaly and atherosclerotic periaortic, unchanged. Mild prominent central vascular congestion. Blunting of  right costophrenic angle is unchanged. No consolidation. No pneumothorax. Degenerative changes of both shoulders.  IMPRESSION: Stable cardiomegaly, prominent pulmonary vasculature,  and small right pleural effusion. No definite acute process.   Electronically Signed   By: Rubye Oaks M.D.   On: 08/28/2014 06:34     CBC  Recent Labs Lab 08/28/14 0612 08/29/14 0632  WBC 6.8 11.6*  HGB 10.3* 9.3*  HCT 33.0* 30.2*  PLT 211 197  MCV 78.5* 77.1*  MCH 24.5* 23.6*  MCHC 31.2* 30.6*  RDW 19.2* 19.2*    Chemistries   Recent Labs Lab 08/28/14 0612 08/28/14 1223 08/29/14 0632  NA 135 138 138  K 7.0* 5.0 4.3  CL 104 103 100*  CO2 21* 27 28  GLUCOSE 135* 103* 111*  BUN 42* 43* 36*  CREATININE 1.83* 1.66* 1.60*  CALCIUM 9.9 11.2* 9.7  MG 2.2  --   --    ------------------------------------------------------------------------------------------------------------------ estimated creatinine clearance is 17.8 mL/min (by C-G formula based on Cr of 1.6). ------------------------------------------------------------------------------------------------------------------ No results for input(s): HGBA1C in the last 72 hours. ------------------------------------------------------------------------------------------------------------------ No results for input(s): CHOL, HDL, LDLCALC, TRIG, CHOLHDL, LDLDIRECT in the last 72 hours. ------------------------------------------------------------------------------------------------------------------ No results for input(s): TSH, T4TOTAL, T3FREE, THYROIDAB in the last 72 hours.  Invalid input(s): FREET3 ------------------------------------------------------------------------------------------------------------------ No results for input(s): VITAMINB12, FOLATE, FERRITIN, TIBC, IRON, RETICCTPCT in the last 72 hours.  Coagulation profile  Recent Labs Lab 08/28/14 0612 08/29/14 0632  INR 2.94 3.35    No results for input(s): DDIMER in the last  72 hours.  Cardiac Enzymes  Recent Labs Lab 08/28/14 0612  TROPONINI 0.04*   ------------------------------------------------------------------------------------------------------------------ Invalid input(s): POCBNP    Assessment & Plan   1. Symptomatic bradycardia with junctional rhythm. Due to combination of digoxin toxicity hyperkalemia and metoprolol. Status post treatment with Digibind. Heart rate is currently stable. On low-dose dopamine  2. Severe hyperkalemia and acute renal failure. 3. History of atrial fibrillation-  comadin on hold 4. History of congestive heart failure-  With acute exacerbation I'll start patient on low-dose Lasix  5. Hyperlipidemia unspecified continue simvastatin. 6. Gastroesophageal reflux disease without esophagitis on omeprazole at home will continue Protonix here. 7. Anxiety continue Xanax. 8. Lower extremity edema  9. Lower extremity erythema- nonot clear if this cellulitis patient reports that she has is chronic redness I will increase the Keflex dose 10 restless leg syndrome      Code Status Orders        Start     Ordered   08/28/14 0804  Do not attempt resuscitation (DNR)   Continuous    Question Answer Comment  In the event of cardiac or respiratory ARREST Do not call a "code blue"   In the event of cardiac or respiratory ARREST Do not perform Intubation, CPR, defibrillation or ACLS   In the event of cardiac or respiratory ARREST Use medication by any route, position, wound care, and other measures to relive pain and suffering. May use oxygen, suction and manual treatment of airway obstruction as needed for comfort.   Comments nurse may pronounce      08/28/14 0804    Advance Directive Documentation        Most Recent Value   Type of Advance Directive  -- [pt states she has a DNR but not provided by facility at this time.]   Pre-existing out of facility DNR order (yellow form or pink MOST form)     "MOST" Form in Place?              Consults cardiology   DVT ProphylaxisSCDs   Lab Results  Component Value Date   PLT  197 08/29/2014     Time Spent in minutes   35 minutes of critical care time     Auburn Bilberry M.D on 08/29/2014 at 12:49 PM  Between 7am to 6pm - Pager - [phone]  After 6pm go to [email or web address] - password [email or web address] [facility]  [facility]   Office  [phone]

## 2014-08-29 NOTE — Consult Note (Addendum)
Palliative Medicine Inpatient Consult Note   Name: Claudia Warren Date: 08/29/2014 MRN: [id number]  DOB: August 15, 1917  Referring Physician: Auburn Bilberry, MD  Palliative Care consult requested for this 81 y.o. female for goals of medical therapy in patient with symptomatic bradycardia, severe aortic stenosis, acute renal failure with hyperkalemia and other conditions as detailed below.  She is improved and off BIPAP and able to talk today.  She remains on Dopamine.     PLAN: I spent this visit having a supportive discussion with the patient.   She stated she would not want CPR, a feeding tube, or to be placed on a ventilator. She is fine with the medications used to control or help cardiac rhythm and rate.  (Will add these to orders)   She wants to be treated as long as she has the potential for returning to have a life where she has something to offer others.  She would not want aggressive care if it was thought to be futile or of very limited benefit in terms of getting her back to living a life where she can still provide service to others (she still does some sewing and alterations for others, etc).  She has a strong faith and says she is letting God determine when he wants to keep her here and when he wants to call her home. She has settled her own funeral plans --done years ago after she began to lose all her relatives.  At this point, given her severe aortic stenosis, some signs of some possible short term recall deficits, she might be a candidate for a rehab stay, though she lives at [facility] and likes it there.  She might not recover and could decompensate and if that were to occur, she would likely be open to comfort care --based on my talk with her today.   She could be referred to [facility] as an outpatient as her severe aortic stenosis would qualify her for this (unless she would be considered a candidate for a less invasive AV repair procedure).  She would not have to be 'comfort care  only' to qualify for this in the home setting.  Certainly, if she would be made 'comfort care only' then she would also be eligible.  She might do well to be on fewer meds and more comfort meds given her limited anticipated remaining lifespan.  I will return Monday and talk with her and her doctors further at that time.    REVIEW OF SYSTEMS:  Not able to give full ROS due to condition. She is quite verbal but she becomes philosophical about why God has her here still instead of answering questions about ROS posed to her.   SPIRITUAL SUPPORT SYSTEM: Yes --Her sister is here with her.  She has a strong [place] faith which she refers to in conversation.  .  SOCIAL HISTORY:  reports that she has never smoked. She has never used smokeless tobacco. She reports that she does not drink alcohol or use illicit drugs.  Patient lost two adult children, her husband, and a spouse of one of her children all within a few years time since 2003.  No surviving children remain.  She has only a sister and a niece apparently.    LEGAL DOCUMENTS:  Will review chart   CODE STATUS: DNR  PAST MEDICAL HISTORY: Past Medical History  Diagnosis Date  . CHF (congestive heart failure)   . Hypertension   . Anxiety   . Hypothyroidism   .  Atrial fibrillation   . Edema   . Restless leg   . Meniere disease     PAST SURGICAL HISTORY:  Past Surgical History  Procedure Laterality Date  . Abdominal hysterectomy    . Cholecystectomy    . Cataract extraction N/A   . Thyroid surgery N/A     ALLERGIES:  is allergic to sulfa antibiotics and penicillins.  MEDICATIONS:  Current Facility-Administered Medications  Medication Dose Route Frequency Provider Last Rate Last Dose  . acetaminophen (TYLENOL) tablet 650 mg  650 mg Oral Q6H PRN Alford Highland, MD       Or  . acetaminophen (TYLENOL) suppository 650 mg  650 mg Rectal Q6H PRN Alford Highland, MD      . ALPRAZolam Prudy Feeler) tablet 0.25 mg  0.25 mg Oral BH-q7a  Alford Highland, MD   0.25 mg at 08/28/14 2251  . antiseptic oral rinse (CPC / CETYLPYRIDINIUM CHLORIDE 0.05%) solution 7 mL  7 mL Mouth Rinse BID Alford Highland, MD   7 mL at 08/28/14 1558  . cephALEXin (KEFLEX) capsule 250 mg  250 mg Oral Q12H Alford Highland, MD   250 mg at 08/28/14 2152  . DOPamine (INTROPIN) 800 mg in dextrose 5 % 250 mL (3.2 mg/mL) infusion  0-20 mcg/kg/min Intravenous Continuous Sharyn Creamer, MD 7.4 mL/hr at 08/28/14 1400 6 mcg/kg/min at 08/28/14 1400  . levothyroxine (SYNTHROID, LEVOTHROID) tablet 75 mcg  75 mcg Oral Rayetta Humphrey, MD   75 mcg at 08/28/14 1200  . loratadine (CLARITIN) tablet 10 mg  10 mg Oral Daily Alford Highland, MD   10 mg at 08/28/14 1200  . pantoprazole (PROTONIX) EC tablet 40 mg  40 mg Oral Daily Alford Highland, MD   40 mg at 08/28/14 1200  . polyethylene glycol (MIRALAX / GLYCOLAX) packet 17 g  17 g Oral Daily PRN Alford Highland, MD      . pramipexole (MIRAPEX) tablet 0.75 mg  0.75 mg Oral QPM Alford Highland, MD   0.75 mg at 08/28/14 1741  . simvastatin (ZOCOR) tablet 20 mg  20 mg Oral Daily Alford Highland, MD   20 mg at 08/28/14 1201  . sodium chloride 0.9 % injection 3 mL  3 mL Intravenous Q12H Alford Highland, MD   3 mL at 08/28/14 1209    Vital Signs: BP 105/57 mmHg  Pulse 70  Temp(Src) 98.3 F (36.8 C) (Oral)  Resp 22  Ht 5' (1.524 m)  Wt 65.772 kg (145 lb)  BMI 28.32 kg/m2  SpO2 95% Filed Weights   08/28/14 0602 08/28/14 0647  Weight: 68.947 kg (152 lb) 65.772 kg (145 lb)    Estimated body mass index is 28.32 kg/(m^2) as calculated from the following:   Height as of this encounter: 5' (1.524 m).   Weight as of this encounter: 65.772 kg (145 lb).  PERFORMANCE STATUS (ECOG) : 2 - Symptomatic, <50% confined to bed  PHYSICAL EXAM: NAD Appears much younger than stated age Alert and fully oriented (but she does repeat herself, so there may be some short term recall deficits) EOMI and OP clear and Nares patent  No JVD  or thyromegaly seen this visit Heart rrr with irreg beats and 3/6 HSM Lungs cta anteriorly and no conversational dyspnea Abd soft and nontender with nl BS Ext 2plus bilat edema with erythema noted also  LABS: CBC:    Component Value Date/Time   WBC 6.8 08/28/2014 0612   WBC 6.0 03/29/2011 1158   HGB 10.3* 08/28/2014 0612   HGB  10.9* 03/29/2011 1158   HCT 33.0* 08/28/2014 0612   HCT 34.2* 03/29/2011 1158   PLT 211 08/28/2014 0612   PLT 186 03/29/2011 1158   MCV 78.5* 08/28/2014 0612   MCV 85 03/29/2011 1158   NEUTROABS 4.8 03/29/2011 1158   LYMPHSABS 0.7* 03/29/2011 1158   MONOABS 0.5 03/29/2011 1158   EOSABS 0.0 03/29/2011 1158   BASOSABS 0.0 03/29/2011 1158   Comprehensive Metabolic Panel:    Component Value Date/Time   NA 138 08/28/2014 1223   NA 140 03/29/2011 1158   K 5.0 08/28/2014 1223   K 4.2 03/29/2011 1158   CL 103 08/28/2014 1223   CL 101 03/29/2011 1158   CO2 27 08/28/2014 1223   CO2 32 03/29/2011 1158   BUN 43* 08/28/2014 1223   BUN 20* 03/29/2011 1158   CREATININE 1.66* 08/28/2014 1223   CREATININE 0.90 03/29/2011 1158   GLUCOSE 103* 08/28/2014 1223   GLUCOSE 114* 03/29/2011 1158   CALCIUM 11.2* 08/28/2014 1223   CALCIUM 9.6 03/29/2011 1158    IMPRESSION: 1) Symptomatic bradycardia with junctional rhythm 2) Digoxin toxcity (on dopamine and digibind) 3) Beta Blocker toxicity (with calcium chloride given) 4) Severe Hyperkalemia (treated and home potassium supplementation is stopped) 5) Acute Renal Failure (hydration, bicarb, and cessation of spironolactone) 6) Atrial Fibrillation --anticoagulated with coumadin now discontinued since she may require invasive procedure near future 7) Acute Diastolic CHF (echo showed EF to be 65 - 70%) / Pulmonary Edema (from acute renal failure and severe aortic stenosis) 8) Severe Aortic Stenosis seen on echo 9) Severe Mitral Regurgitation seen on echo 10) Moderate to Severe Left Atrial Dilation seen on echo 11)  Dyslipidemia 12) GERD 13) Lower leg edema and erythema 14) Restless Leg Syndrome 15) Anxiety 16) Hypercalcemia 17) Anemia of unclear etiology   SEE ABOVE FOR PLAN  REFERRALS TO BE ORDERED:  NONE AS YET MAY CONSIDER HOSPICE CONSULT IF COMFORT CARE IS INITIATED  ALSO --she would be eligible for HOSPICE in the home--even if comfort care is NOT initiated and routine care for her medical conditions results in stabilization for her to be able to be discharged. ( This is due to her Severe Aortic Stenosis. )    More than 50% of the visit was spent in counseling/coordination of care: Yes  Time Spent: 55 minutes

## 2014-08-29 NOTE — Progress Notes (Signed)
[facility] Encounter Note  Patient: Claudia Warren / Admit Date: 08/28/2014 / Date of Encounter: 08/29/2014, 10:23 AM   Subjective: Patient feeling much better at this time with no chest pain or heart failure symptoms and with reasonable heart rate  Review of Systems: Positive for: Weakness Negative for: Vision change, hearing change, syncope, dizziness, nausea, vomiting,diarrhea, bloody stool, stomach pain, cough, congestion, diaphoresis, urinary frequency, urinary pain,skin lesions, skin rashes Others previously listed  Objective: Telemetry: Atrial fibrillation with controlled ventricular rate Physical Exam: Blood pressure 126/54, pulse 87, temperature 98.1 F (36.7 C), temperature source Oral, resp. rate 25, height 5' (1.524 m), weight 145 lb (65.772 kg), SpO2 96 %. Body mass index is 28.32 kg/(m^2). General: Well developed, well nourished, in no acute distress. Head: Normocephalic, atraumatic, sclera non-icteric, no xanthomas, nares are without discharge. Neck: No apparent masses Lungs: Normal respirations with no wheezes, no rhonchi, no rales , no crackles   Heart: Irregular rate and rhythm, normal S1 soft S2, 3 to 4/6 aortic murmur, no rub, no gallop, PMI is normal size and inferior placement, carotid upstroke normal without bruit, jugular venous pressure normal Abdomen: Soft, non-tender, non-distended with normoactive bowel sounds. No hepatosplenomegaly. Abdominal aorta is normal size without bruit Extremities: Trace edema, no clubbing, no cyanosis, no ulcers,  Peripheral: 2+ radial, 2+ femoral, 2+ dorsal pedal pulses Neuro: Alert and oriented. Moves all extremities spontaneously. Psych:  Responds to questions appropriately with a normal affect.   Intake/Output Summary (Last 24 hours) at 08/29/14 1023 Last data filed at 08/29/14 0800  Gross per 24 hour  Intake  725.1 ml  Output   1625 ml  Net -899.9 ml    Inpatient Medications:  . ALPRAZolam  0.25  mg Oral BH-q7a  . antiseptic oral rinse  7 mL Mouth Rinse BID  . cephALEXin  250 mg Oral Q12H  . furosemide  20 mg Intravenous Q12H  . levothyroxine  75 mcg Oral BH-q7a  . loratadine  10 mg Oral Daily  . pantoprazole  40 mg Oral Daily  . pramipexole  0.75 mg Oral QPM  . simvastatin  20 mg Oral Daily  . sodium chloride  3 mL Intravenous Q12H   Infusions:  . DOPamine 2 mcg/kg/min (08/28/14 1900)    Labs:  Recent Labs  08/28/14 0612 08/28/14 1223 08/29/14 0632  NA 135 138 138  K 7.0* 5.0 4.3  CL 104 103 100*  CO2 21* 27 28  GLUCOSE 135* 103* 111*  BUN 42* 43* 36*  CREATININE 1.83* 1.66* 1.60*  CALCIUM 9.9 11.2* 9.7  MG 2.2  --   --    No results for input(s): AST, ALT, ALKPHOS, BILITOT, PROT, ALBUMIN in the last 72 hours.  Recent Labs  08/28/14 0612 08/29/14 0632  WBC 6.8 11.6*  HGB 10.3* 9.3*  HCT 33.0* 30.2*  MCV 78.5* 77.1*  PLT 211 197    Recent Labs  08/28/14 0612  TROPONINI 0.04*   Invalid input(s): POCBNP No results for input(s): HGBA1C in the last 72 hours.   Weights: Filed Weights   08/28/14 0602 08/28/14 0647  Weight: 152 lb (68.947 kg) 145 lb (65.772 kg)     Radiology/Studies:  Dg Chest 1 View  08/29/2014   CLINICAL DATA:  81 year old female with a history of dyspnea.  EXAM: CHEST  1 VIEW  COMPARISON:  08/28/2014, 03/13/2014  FINDINGS: Cardiomediastinal silhouette unchanged. Atherosclerotic calcifications of the aortic arch.  Defibrillator pads remain on the chest.  Mixed interstitial and airspace  disease, with prominence of the central vasculature. Similar interlobular septal thickening. Left base not well evaluated.  No pneumothorax.  IMPRESSION: Persisting evidence of congestive heart failure, with mildly worsened edema and atelectasis. Cannot rule out left basilar pleural effusion.  Unchanged position of defibrillator pads.  Atherosclerosis.  Signed,  Yvone Neu. Loreta Ave, DO  [facility]  [facility] Radiology    Electronically Signed   By: Gilmer Mor D.O.   On: 08/29/2014 08:44   Dg Chest Port 1 View  08/28/2014   CLINICAL DATA:  Respiratory failure. Bradycardia, renal failure and congestive heart failure.  EXAM: PORTABLE CHEST - 1 VIEW  COMPARISON:  Film earlier today at 0600 hr.  FINDINGS: Worsening congestive heart failure present with more overt pulmonary edema now present. No significant pleural effusions. Stable moderate cardiomegaly. Pacing pads remain overlying the chest. No pneumothorax.  IMPRESSION: Worsening CHF.   Electronically Signed   By: Irish Lack M.D.   On: 08/28/2014 10:45   Dg Chest Port 1 View  08/28/2014   CLINICAL DATA:  Awoke with weakness and shortness of breath.  EXAM: PORTABLE CHEST - 1 VIEW  COMPARISON:  Frontal and lateral views 03/13/2014  FINDINGS: Cardiomegaly and atherosclerotic periaortic, unchanged. Mild prominent central vascular congestion. Blunting of right costophrenic angle is unchanged. No consolidation. No pneumothorax. Degenerative changes of both shoulders.  IMPRESSION: Stable cardiomegaly, prominent pulmonary vasculature, and small right pleural effusion. No definite acute process.   Electronically Signed   By: Rubye Oaks M.D.   On: 08/28/2014 06:34     Assessment and Recommendation  81 y.o. female with acute renal failure causing hyperkalemia as well as significant bradycardia secondary to medication management with metoprolol and digoxin without evidence of congestive heart failure or myocardial infarction with stable aortic valve stenosis 1. Discontinuation of dopamine and follow for heart rate control 2. Possible reinstatement of medication management and/or the possibility of amiodarone use for heart rate control and maintenance of normal sinus rhythm without causing bradycardia 3. Transfer to telemetry and follow for improvements of renal failure and other significant symptoms 4. Avoid digoxin due to renal failure and bradycardia 5. No further  cardiac diagnostics necessary at this time  Signed, Arnoldo Hooker M.D. FACC

## 2014-08-29 NOTE — Progress Notes (Signed)
Patient placed on ventimask @ 45% due to low sats with sleeping.  Did not rest well.  Restless throughout the night.  Has RLS and is on merapex.  Xanax given, but patient only slept short periods of time.  Converted to NSR.  Remains on Dopamine drip @ .  HR and B/P stable.

## 2014-08-29 NOTE — Progress Notes (Signed)
[facility]  ROUNDING NOTE   Subjective:   Continues on dopamine gtt Church family at bedside. Eating breakfast  Objective:  Vital signs in last 24 hours:  Temp:  [97.7 F (36.5 C)-98.6 F (37 C)] 98.1 F (36.7 C) (07/30 0714) Pulse Rate:  [39-118] 87 (07/30 0700) Resp:  [15-28] 25 (07/30 0700) BP: (83-166)/(38-117) 126/54 mmHg (07/30 0700) SpO2:  [79 %-100 %] 96 % (07/30 0700) FiO2 (%):  [45 %] 45 % (07/30 0015)  Weight change:  Filed Weights   08/28/14 0602 08/28/14 0647  Weight: 68.947 kg (152 lb) 65.772 kg (145 lb)    Intake/Output: I/O last 3 completed shifts: In: 741 [P.O.:600; I.V.:141] Out: 2125 [Urine:2125]   Intake/Output this shift:  Total I/O In: 2.4 [I.V.:2.4] Out: -   Physical Exam: General: Critically ill  Head: Normocephalic, atraumatic. Moist oral mucosal membranes  Eyes: Anicteric, PERRL  Neck: Supple, trachea midline  Lungs:  Bilateral crackles  Heart: Regular rate and rhythm  Abdomen:  Soft, nontender,   Extremities:  trace peripheral edema.  Neurologic: Nonfocal, moving all four extremities  Skin: No lesions       Basic Metabolic Panel:  Recent Labs Lab 08/28/14 0612 08/28/14 1223 08/29/14 0632  NA 135 138 138  K 7.0* 5.0 4.3  CL 104 103 100*  CO2 21* 27 28  GLUCOSE 135* 103* 111*  BUN 42* 43* 36*  CREATININE 1.83* 1.66* 1.60*  CALCIUM 9.9 11.2* 9.7  MG 2.2  --   --     Liver Function Tests: No results for input(s): AST, ALT, ALKPHOS, BILITOT, PROT, ALBUMIN in the last 168 hours. No results for input(s): LIPASE, AMYLASE in the last 168 hours. No results for input(s): AMMONIA in the last 168 hours.  CBC:  Recent Labs Lab 08/28/14 0612 08/29/14 0632  WBC 6.8 11.6*  HGB 10.3* 9.3*  HCT 33.0* 30.2*  MCV 78.5* 77.1*  PLT 211 197    Cardiac Enzymes:  Recent Labs Lab 08/28/14 0612  TROPONINI 0.04*    BNP: Invalid input(s): POCBNP  CBG:  Recent Labs Lab 08/28/14 0840  GLUCAP 167*     Microbiology: Results for orders placed or performed during the hospital encounter of 08/28/14  MRSA PCR Screening     Status: None   Collection Time: 08/28/14  9:14 AM  Result Value Ref Range Status   MRSA by PCR NEGATIVE NEGATIVE Final    Comment:        The GeneXpert MRSA Assay (FDA approved for NASAL specimens only), is one component of a comprehensive MRSA colonization surveillance program. It is not intended to diagnose MRSA infection nor to guide or monitor treatment for MRSA infections.     Coagulation Studies:  Recent Labs  08/28/14 0612 08/29/14 0632  LABPROT 30.7* 34.0*  INR 2.94 3.35    Urinalysis: No results for input(s): COLORURINE, LABSPEC, PHURINE, GLUCOSEU, HGBUR, BILIRUBINUR, KETONESUR, PROTEINUR, UROBILINOGEN, NITRITE, LEUKOCYTESUR in the last 72 hours.  Invalid input(s): APPERANCEUR    Imaging: Dg Chest 1 View  08/29/2014   CLINICAL DATA:  81 year old female with a history of dyspnea.  EXAM: CHEST  1 VIEW  COMPARISON:  08/28/2014, 03/13/2014  FINDINGS: Cardiomediastinal silhouette unchanged. Atherosclerotic calcifications of the aortic arch.  Defibrillator pads remain on the chest.  Mixed interstitial and airspace disease, with prominence of the central vasculature. Similar interlobular septal thickening. Left base not well evaluated.  No pneumothorax.  IMPRESSION: Persisting evidence of congestive heart failure, with mildly worsened edema and atelectasis.  Cannot rule out left basilar pleural effusion.  Unchanged position of defibrillator pads.  Atherosclerosis.  Signed,  Yvone Neu. Loreta Ave, DO  [facility]  [facility] Radiology   Electronically Signed   By: Gilmer Mor D.O.   On: 08/29/2014 08:44   Dg Chest Port 1 View  08/28/2014   CLINICAL DATA:  Respiratory failure. Bradycardia, renal failure and congestive heart failure.  EXAM: PORTABLE CHEST - 1 VIEW  COMPARISON:  Film earlier today at 0600 hr.  FINDINGS:  Worsening congestive heart failure present with more overt pulmonary edema now present. No significant pleural effusions. Stable moderate cardiomegaly. Pacing pads remain overlying the chest. No pneumothorax.  IMPRESSION: Worsening CHF.   Electronically Signed   By: Irish Lack M.D.   On: 08/28/2014 10:45   Dg Chest Port 1 View  08/28/2014   CLINICAL DATA:  Awoke with weakness and shortness of breath.  EXAM: PORTABLE CHEST - 1 VIEW  COMPARISON:  Frontal and lateral views 03/13/2014  FINDINGS: Cardiomegaly and atherosclerotic periaortic, unchanged. Mild prominent central vascular congestion. Blunting of right costophrenic angle is unchanged. No consolidation. No pneumothorax. Degenerative changes of both shoulders.  IMPRESSION: Stable cardiomegaly, prominent pulmonary vasculature, and small right pleural effusion. No definite acute process.   Electronically Signed   By: Rubye Oaks M.D.   On: 08/28/2014 06:34     Medications:   . DOPamine 2 mcg/kg/min (08/28/14 1900)   . ALPRAZolam  0.25 mg Oral BH-q7a  . antiseptic oral rinse  7 mL Mouth Rinse BID  . cephALEXin  250 mg Oral Q12H  . furosemide  20 mg Intravenous Q12H  . levothyroxine  75 mcg Oral BH-q7a  . loratadine  10 mg Oral Daily  . pantoprazole  40 mg Oral Daily  . pramipexole  0.75 mg Oral QPM  . simvastatin  20 mg Oral Daily  . sodium chloride  3 mL Intravenous Q12H   acetaminophen **OR** acetaminophen, polyethylene glycol  Assessment/ Plan:  [name] is a 81 y.o. white female with congestive heart failure, atrial fibrillation, aortic stenosis, anxiety, hypertension, meniere's disease and hypothyroidism who was admitted to [facility] on 08/28/2014 for bradycardia  1. Acute renal failure with hyperkalemia: on chronic kidney disease stage III with baseline creatinine of 1. Acute renal failure and hyperkalemia seem to be secondary to acute exacerbation of congestive heart failure.  Hyperkalemia secondary to oral potassium  supplements along with spironolactone. High tomato intake.  Nonoliguric urine output - Status post D50, calcium and bicarb infusion.  - Recheck renal function  - Patient does not want dialysis.   2. Acute exacerbation of congestive heart failure with bradycardia and heart block: appreciate cardiology input. Outpatient cardiologist is Dr. Juel Burrow. Echocardiogramwith aortic stenosis - May need IV diuretics.  - dopamine gtt.    LOS: 1 Claudia Warren 7/30/20169:00 AM

## 2014-08-29 NOTE — Progress Notes (Signed)
RN paged Dr. Allena Katz and he called unit back. RN made Dr. Allena Katz aware that Dopamine has been off since 1100 and heart rate has maintained but that blood pressure has lowered with MAP ranging as low as 56 and normal into the 70's and that patient is asymptomatic, alert and adequate urine output.  Dr. Allena Katz stated "just monitor her for now."

## 2014-08-29 NOTE — Progress Notes (Addendum)
RN made Dr. Gwen Pounds aware that patient's digoxin level is critical and 3.4.  Dr. Gwen Pounds stated "thats okay she already had the digibind so it has bonded." no new orders.

## 2014-08-30 ENCOUNTER — Inpatient Hospital Stay: Payer: Medicare Other

## 2014-08-30 LAB — BASIC METABOLIC PANEL
ANION GAP: 9 (ref 5–15)
BUN: 34 mg/dL — ABNORMAL HIGH (ref 6–20)
CHLORIDE: 98 mmol/L — AB (ref 101–111)
CO2: 29 mmol/L (ref 22–32)
CREATININE: 1.49 mg/dL — AB (ref 0.44–1.00)
Calcium: 9.2 mg/dL (ref 8.9–10.3)
GFR calc non Af Amer: 29 mL/min — ABNORMAL LOW (ref >60)
GFR, EST AFRICAN AMERICAN: 33 mL/min — AB (ref >60)
Glucose, Bld: 117 mg/dL — ABNORMAL HIGH (ref 65–99)
POTASSIUM: 3.8 mmol/L (ref 3.5–5.1)
SODIUM: 136 mmol/L (ref 135–145)

## 2014-08-30 LAB — CBC
HEMATOCRIT: 25.7 % — AB (ref 35.0–47.0)
HEMOGLOBIN: 8.1 g/dL — AB (ref 12.0–16.0)
MCH: 24.1 pg — AB (ref 26.0–34.0)
MCHC: 31.4 g/dL — ABNORMAL LOW (ref 32.0–36.0)
MCV: 76.7 fL — ABNORMAL LOW (ref 80.0–100.0)
Platelets: 153 10*3/uL (ref 150–440)
RBC: 3.35 MIL/uL — ABNORMAL LOW (ref 3.80–5.20)
RDW: 19 % — ABNORMAL HIGH (ref 11.5–14.5)
WBC: 7.1 10*3/uL (ref 3.6–11.0)

## 2014-08-30 LAB — PROTIME-INR
INR: 3.16
Prothrombin Time: 32.5 seconds — ABNORMAL HIGH (ref 11.4–15.0)

## 2014-08-30 NOTE — Progress Notes (Signed)
[facility] - [place] [facility]                                                                                                                                                                                            Patient Demographics   Claudia Warren, is a 81 y.o. female, DOB - Nov 25, 1917, ZOX:[id number]  Admit date - 08/28/2014   Admitting Physician Alford Highland, MD  Outpatient Primary MD for the patient is No primary care provider on file.   LOS - 2  Subjective: Shortness of breath improved, heart rate stable,    Review of Systems:   CONSTITUTIONAL: No documented fever. No fatigue, weakness. No weight gain, no weight loss.  EYES: No blurry or double vision.  ENT: No tinnitus. No postnasal drip. No redness of the oropharynx.  RESPIRATORY: No cough, no wheeze, no hemoptysis. Positive dyspnea.  CARDIOVASCULAR: No chest pain. No orthopnea. No palpitations. No syncope.  GASTROINTESTINAL: No nausea, no vomiting or diarrhea. No abdominal pain. No melena or hematochezia.  GENITOURINARY: No dysuria or hematuria.  ENDOCRINE: No polyuria or nocturia. No heat or cold intolerance.  HEMATOLOGY: No anemia. No bruising. No bleeding.  INTEGUMENTARY: No rashes. No lesions.  MUSCULOSKELETAL: No arthritis. No swelling. No gout.  NEUROLOGIC: No numbness, tingling, or ataxia. No seizure-type activity.  PSYCHIATRIC: No anxiety. No insomnia. No ADD.    Vitals:   Filed Vitals:   08/30/14 0700 08/30/14 0800 08/30/14 0900 08/30/14 1000  BP: 123/41 109/53 82/70 120/57  Pulse: 72 69 102 64  Temp: 97.9 F (36.6 C)     TempSrc: Axillary     Resp: 19 26 21 24   Height:      Weight:      SpO2: 100% 99% 97% 98%    Wt Readings from Last 3 Encounters:  08/28/14 65.772 kg (145 lb)     Intake/Output Summary (Last 24 hours) at 08/30/14 1035 Last data filed at 08/30/14 0900  Gross per 24 hour  Intake    600 ml  Output   1290 ml  Net   -690 ml    Physical Exam:    GENERAL: Critically ill.  HEAD, EYES, EARS, NOSE AND THROAT: Atraumatic, normocephalic. Extraocular muscles are intact. Pupils equal and reactive to light. Sclerae anicteric. No conjunctival injection. No oro-pharyngeal erythema.  NECK: Supple. There is no jugular venous distention. No bruits, no lymphadenopathy, no thyromegaly.  HEART: Regular rate and rhythm, tachycardic. No murmurs, no rubs, no clicks.  LUNGS: Clear to auscultation bilaterally. No rales or rhonchi. No wheezes.  ABDOMEN: Soft, flat, nontender, nondistended. Has good bowel sounds. No hepatosplenomegaly  appreciated.  EXTREMITIES: No evidence of any cyanosis, clubbing, or peripheral edema.  +2 pedal and radial pulses bilaterally.  NEUROLOGIC: The patient is alert, awake, and oriented x3 with no focal motor or sensory deficits appreciated bilaterally.  SKIN: Moist and warm with no rashes appreciated.  Psych: Not anxious, depressed LN: No inguinal LN enlargement    Antibiotics   Anti-infectives    Start     Dose/Rate Route Frequency Ordered Stop   08/28/14 1000  cephALEXin (KEFLEX) capsule 250 mg     250 mg Oral Every 12 hours 08/28/14 0856        Medications   Scheduled Meds: . ALPRAZolam  0.25 mg Oral BH-q7a  . antiseptic oral rinse  7 mL Mouth Rinse BID  . cephALEXin  250 mg Oral Q12H  . furosemide  20 mg Intravenous Q12H  . levothyroxine  75 mcg Oral BH-q7a  . loratadine  10 mg Oral Daily  . pantoprazole  40 mg Oral Daily  . pramipexole  0.75 mg Oral QPM  . simvastatin  20 mg Oral Daily  . sodium chloride  3 mL Intravenous Q12H   Continuous Infusions:  PRN Meds:.acetaminophen **OR** acetaminophen, polyethylene glycol   Data Review:   Micro Results Recent Results (from the past 240 hour(s))  MRSA PCR Screening     Status: None   Collection Time: 08/28/14  9:14 AM  Result Value Ref Range Status   MRSA by PCR NEGATIVE NEGATIVE Final    Comment:        The GeneXpert MRSA Assay (FDA approved for  NASAL specimens only), is one component of a comprehensive MRSA colonization surveillance program. It is not intended to diagnose MRSA infection nor to guide or monitor treatment for MRSA infections.     Radiology Reports Dg Chest 1 View  08/30/2014   CLINICAL DATA:  Shortness of breath  EXAM: CHEST  1 VIEW  COMPARISON:  08/29/2014  FINDINGS: There is cardiomegaly with vascular congestion. Diffuse interstitial prominence has slightly improved. There are small bilateral pleural effusions with bibasilar atelectasis.  IMPRESSION: Improving interstitial edema pattern.  Mild edema persists.  Small bilateral pleural effusions with bibasilar atelectasis.   Electronically Signed   By: Charlett Nose M.D.   On: 08/30/2014 09:20   Dg Chest 1 View  08/29/2014   CLINICAL DATA:  81 year old female with a history of dyspnea.  EXAM: CHEST  1 VIEW  COMPARISON:  08/28/2014, 03/13/2014  FINDINGS: Cardiomediastinal silhouette unchanged. Atherosclerotic calcifications of the aortic arch.  Defibrillator pads remain on the chest.  Mixed interstitial and airspace disease, with prominence of the central vasculature. Similar interlobular septal thickening. Left base not well evaluated.  No pneumothorax.  IMPRESSION: Persisting evidence of congestive heart failure, with mildly worsened edema and atelectasis. Cannot rule out left basilar pleural effusion.  Unchanged position of defibrillator pads.  Atherosclerosis.  Signed,  Yvone Neu. Loreta Ave, DO  [facility]  [facility]iology   Electronically Signed   By: Gilmer Mor D.O.   On: 08/29/2014 08:44   Dg Chest Port 1 View  08/28/2014   CLINICAL DATA:  Respiratory failure. Bradycardia, renal failure and congestive heart failure.  EXAM: PORTABLE CHEST - 1 VIEW  COMPARISON:  Film earlier today at 0600 hr.  FINDINGS: Worsening congestive heart failure present with more overt pulmonary edema now present. No significant pleural effusions.  Stable moderate cardiomegaly. Pacing pads remain overlying the chest. No pneumothorax.  IMPRESSION: Worsening CHF.   Electronically Signed  By: Irish Lack M.D.   On: 08/28/2014 10:45   Dg Chest Port 1 View  08/28/2014   CLINICAL DATA:  Awoke with weakness and shortness of breath.  EXAM: PORTABLE CHEST - 1 VIEW  COMPARISON:  Frontal and lateral views 03/13/2014  FINDINGS: Cardiomegaly and atherosclerotic periaortic, unchanged. Mild prominent central vascular congestion. Blunting of right costophrenic angle is unchanged. No consolidation. No pneumothorax. Degenerative changes of both shoulders.  IMPRESSION: Stable cardiomegaly, prominent pulmonary vasculature, and small right pleural effusion. No definite acute process.   Electronically Signed   By: Rubye Oaks M.D.   On: 08/28/2014 06:34     CBC  Recent Labs Lab 08/28/14 0612 08/29/14 0632 08/30/14 0646  WBC 6.8 11.6* 7.1  HGB 10.3* 9.3* 8.1*  HCT 33.0* 30.2* 25.7*  PLT 211 197 153  MCV 78.5* 77.1* 76.7*  MCH 24.5* 23.6* 24.1*  MCHC 31.2* 30.6* 31.4*  RDW 19.2* 19.2* 19.0*    Chemistries   Recent Labs Lab 08/28/14 0612 08/28/14 1223 08/29/14 0632 08/30/14 0646  NA 135 138 138 136  K 7.0* 5.0 4.3 3.8  CL 104 103 100* 98*  CO2 21* GLUCOSE 135* 103* 111* 117*  BUN 42* 43* 36* 34*  CREATININE 1.83* 1.66* 1.60* 1.49*  CALCIUM 9.9 11.2* 9.7 9.2  MG 2.2  --   --   --    ------------------------------------------------------------------------------------------------------------------ estimated creatinine clearance is 19.1 mL/min (by C-G formula based on Cr of 1.49). ------------------------------------------------------------------------------------------------------------------ No results for input(s): HGBA1C in the last 72 hours. ------------------------------------------------------------------------------------------------------------------ No results for input(s): CHOL, HDL, LDLCALC, TRIG, CHOLHDL,  LDLDIRECT in the last 72 hours. ------------------------------------------------------------------------------------------------------------------ No results for input(s): TSH, T4TOTAL, T3FREE, THYROIDAB in the last 72 hours.  Invalid input(s): FREET3 ------------------------------------------------------------------------------------------------------------------ No results for input(s): VITAMINB12, FOLATE, FERRITIN, TIBC, IRON, RETICCTPCT in the last 72 hours.  Coagulation profile  Recent Labs Lab 08/28/14 0612 08/29/14 0632 08/30/14 0646  INR 2.94 3.35 3.16    No results for input(s): DDIMER in the last 72 hours.  Cardiac Enzymes  Recent Labs Lab 08/28/14 0612  TROPONINI 0.04*   ------------------------------------------------------------------------------------------------------------------ Invalid input(s): POCBNP    Assessment & Plan   1. Symptomatic bradycardia with junctional rhythm. Due to combination of digoxin toxicity hyperkalemia and metoprolol. Status post treatment with Digibind. Heart rate is currently stable. Okay to transfer to the telemetry floor 2. Severe hyperkalemia and acute renal failure. Not improved 3. History of atrial fibrillation-  comadin on hold monitor H&H since drop in hemoglobin if stable then we can restart Coumadin 4. Acute on chronic diastolic CHF  continue low-dose IV Lasix  5. Hyperlipidemia unspecified continue simvastatin. 6. Gastroesophageal reflux disease without esophagitis on omeprazole at home will continue Protonix here. 7. Anxiety continue Xanax. 8. Severe aortic stenosis at her age not a good candidate for any type of surgical intervention 9. Lower extremity erythema- nonot clear if this cellulitis patient reports that she has is chronic redness I will increase the Keflex dose 10 restless leg syndrome      Code Status Orders        Start     Ordered   08/28/14 0804  Do not attempt resuscitation (DNR)   Continuous     Question Answer Comment  In the event of cardiac or respiratory ARREST Do not call a "code blue"   In the event of cardiac or respiratory ARREST Do not perform Intubation, CPR, defibrillation or ACLS   In the event of cardiac or respiratory ARREST  Use medication by any route, position, wound care, and other measures to relive pain and suffering. May use oxygen, suction and manual treatment of airway obstruction as needed for comfort.   Comments nurse may pronounce      08/28/14 0804    Advance Directive Documentation        Most Recent Value   Type of Advance Directive  -- [pt states she has a DNR but not provided by facility at this time.]   Pre-existing out of facility DNR order (yellow form or pink MOST form)     "MOST" Form in Place?             Consults cardiology   DVT ProphylaxisSCDs   Lab Results  Component Value Date   PLT 153 08/30/2014     Time Spent in minutes   35 minutes    Auburn Bilberry M.D on 08/30/2014 at 10:35 AM  Between 7am to 6pm - Pager - [phone]  After 6pm go to [email or web address] - password [email or web address] [facility]  [facility]   Office  [phone]

## 2014-08-30 NOTE — Progress Notes (Signed)
[facility]  ROUNDING NOTE   Subjective:   Off dopamine this morning.  [employer] family at bedside.    Objective:  Vital signs in last 24 hours:  Temp:  [97.7 F (36.5 C)-98.3 F (36.8 C)] 97.9 F (36.6 C) (07/31 0700) Pulse Rate:  [51-102] 64 (07/31 1000) Resp:  [16-37] 24 (07/31 1000) BP: (82-152)/(36-133) 120/57 mmHg (07/31 1000) SpO2:  [82 %-100 %] 98 % (07/31 1000)  Weight change:  Filed Weights   08/28/14 0602 08/28/14 0647  Weight: 68.947 kg (152 lb) 65.772 kg (145 lb)    Intake/Output: I/O last 3 completed shifts: In: 664.9 [P.O.:600; I.V.:64.9] Out: 2050 [Urine:2050]   Intake/Output this shift:  Total I/O In: 240 [P.O.:240] Out: 40 [Urine:40]  Physical Exam: General: NAD  Head: Normocephalic, atraumatic. Moist oral mucosal membranes  Eyes: Anicteric, PERRL  Neck: Supple, trachea midline  Lungs:  Bilateral crackles  Heart: Irregular, +murmur  Abdomen:  Soft, nontender,   Extremities:  trace peripheral edema.  Neurologic: Nonfocal, moving all four extremities  Skin: No lesions       Basic Metabolic Panel:  Recent Labs Lab 08/28/14 0612 08/28/14 1223 08/29/14 0632 08/30/14 0646  NA 135 138 138 136  K 7.0* 5.0 4.3 3.8  CL 104 103 100* 98*  CO2 21* GLUCOSE 135* 103* 111* 117*  BUN 42* 43* 36* 34*  CREATININE 1.83* 1.66* 1.60* 1.49*  CALCIUM 9.9 11.2* 9.7 9.2  MG 2.2  --   --   --     Liver Function Tests: No results for input(s): AST, ALT, ALKPHOS, BILITOT, PROT, ALBUMIN in the last 168 hours. No results for input(s): LIPASE, AMYLASE in the last 168 hours. No results for input(s): AMMONIA in the last 168 hours.  CBC:  Recent Labs Lab 08/28/14 0612 08/29/14 0632 08/30/14 0646  WBC 6.8 11.6* 7.1  HGB 10.3* 9.3* 8.1*  HCT 33.0* 30.2* 25.7*  MCV 78.5* 77.1* 76.7*  PLT 211 197 153    Cardiac Enzymes:  Recent Labs Lab 08/28/14 0612  TROPONINI 0.04*    BNP: Invalid input(s): POCBNP  CBG:  Recent  Labs Lab 08/28/14 0840  GLUCAP 167*    Microbiology: Results for orders placed or performed during the hospital encounter of 08/28/14  MRSA PCR Screening     Status: None   Collection Time: 08/28/14  9:14 AM  Result Value Ref Range Status   MRSA by PCR NEGATIVE NEGATIVE Final    Comment:        The GeneXpert MRSA Assay (FDA approved for NASAL specimens only), is one component of a comprehensive MRSA colonization surveillance program. It is not intended to diagnose MRSA infection nor to guide or monitor treatment for MRSA infections.     Coagulation Studies:  Recent Labs  08/28/14 0612 08/29/14 0632 08/30/14 0646  LABPROT 30.7* 34.0* 32.5*  INR 2.94 3.35 3.16    Urinalysis: No results for input(s): COLORURINE, LABSPEC, PHURINE, GLUCOSEU, HGBUR, BILIRUBINUR, KETONESUR, PROTEINUR, UROBILINOGEN, NITRITE, LEUKOCYTESUR in the last 72 hours.  Invalid input(s): APPERANCEUR    Imaging: Dg Chest 1 View  08/30/2014   CLINICAL DATA:  Shortness of breath  EXAM: CHEST  1 VIEW  COMPARISON:  08/29/2014  FINDINGS: There is cardiomegaly with vascular congestion. Diffuse interstitial prominence has slightly improved. There are small bilateral pleural effusions with bibasilar atelectasis.  IMPRESSION: Improving interstitial edema pattern.  Mild edema persists.  Small bilateral pleural effusions with bibasilar atelectasis.   Electronically Signed   By:  Charlett Nose M.D.   On: 08/30/2014 09:20   Dg Chest 1 View  08/29/2014   CLINICAL DATA:  81 year old female with a history of dyspnea.  EXAM: CHEST  1 VIEW  COMPARISON:  08/28/2014, 03/13/2014  FINDINGS: Cardiomediastinal silhouette unchanged. Atherosclerotic calcifications of the aortic arch.  Defibrillator pads remain on the chest.  Mixed interstitial and airspace disease, with prominence of the central vasculature. Similar interlobular septal thickening. Left base not well evaluated.  No pneumothorax.  IMPRESSION: Persisting evidence of  congestive heart failure, with mildly worsened edema and atelectasis. Cannot rule out left basilar pleural effusion.  Unchanged position of defibrillator pads.  Atherosclerosis.  Signed,  Yvone Neu. Loreta Ave, DO  [facility]  [facility] Radiology   Electronically Signed   By: Gilmer Mor D.O.   On: 08/29/2014 08:44     Medications:     . ALPRAZolam  0.25 mg Oral BH-q7a  . antiseptic oral rinse  7 mL Mouth Rinse BID  . cephALEXin  250 mg Oral Q12H  . furosemide  20 mg Intravenous Q12H  . levothyroxine  75 mcg Oral BH-q7a  . loratadine  10 mg Oral Daily  . pantoprazole  40 mg Oral Daily  . pramipexole  0.75 mg Oral QPM  . simvastatin  20 mg Oral Daily  . sodium chloride  3 mL Intravenous Q12H   acetaminophen **OR** acetaminophen, polyethylene glycol  Assessment/ Plan:  [name] is a 81 y.o. white female with congestive heart failure, atrial fibrillation, aortic stenosis, anxiety, hypertension, meniere's disease and hypothyroidism who was admitted to [facility] on 08/28/2014 for bradycardia  1. Acute renal failure with hyperkalemia: on chronic kidney disease stage III with baseline creatinine of 1. Acute renal failure and hyperkalemia seem to be secondary to acute exacerbation of congestive heart failure.  Hyperkalemia secondary to oral potassium supplements along with spironolactone. High tomato intake.  Nonoliguric urine output - Potassium now at goal. Creatinine trending down.  - Patient does not want dialysis.   2. Acute exacerbation of congestive heart failure with aortic valve stenosis and irregular heart rate: appreciate cardiology input. Outpatient cardiologist is Dr. Juel Burrow. Echocardiogram with aortic stenosis and diastolic dysfunction - furosemide  - free water restriction    LOS: 2 Tou Hayner 7/31/201611:18 AM

## 2014-08-30 NOTE — Progress Notes (Signed)
Pt transferred from CCU to room 238 via bed.  Cardiac monitor in place, pt denies chest pain.   SL lt fa flushes well.  No distress on 4LO2 per [place], pt gets short of breath moving in bed.  Lungs with scattered crackles, diminished lower lobes bil.  Pt has 1+ generalized edema.  Foley patent with yellow urine.  Oriented to room and surroundings, POC reviewed with pt and family.  Denies need, CB in reach, SR up x2 bed alarm on.

## 2014-08-30 NOTE — Progress Notes (Signed)
[facility] Cardiology [facility] Encounter Note  Patient: Claudia Warren / Admit Date: 08/28/2014 / Date of Encounter: 08/30/2014, 5:23 PM   Subjective: Patient feeling much better at this time with no chest pain or heart failure symptoms and with reasonable heart rate and now spontaneous conversion to normal sinus rhythm  Review of Systems: Positive for: Weakness shortness of breath Negative for: Vision change, hearing change, syncope, dizziness, nausea, vomiting,diarrhea, bloody stool, stomach pain, cough, congestion, diaphoresis, urinary frequency, urinary pain,skin lesions, skin rashes Others previously listed  Objective: Telemetry: Atrial fibrillation with controlled ventricular rate Physical Exam: Blood pressure 120/45, pulse 72, temperature 98 F (36.7 C), temperature source Oral, resp. rate 22, height 5' (1.524 m), weight 142 lb (64.411 kg), SpO2 98 %. Body mass index is 27.73 kg/(m^2). General: Well developed, well nourished, in no acute distress. Head: Normocephalic, atraumatic, sclera non-icteric, no xanthomas, nares are without discharge. Neck: No apparent masses Lungs: Normal respirations with no wheezes, no rhonchi, no rales , no crackles   Heart: Regular rate and rhythm, normal S1 soft S2, 3 to 4/6 aortic murmur, no rub, no gallop, PMI is normal size and inferior placement, carotid upstroke normal without bruit, jugular venous pressure normal Abdomen: Soft, non-tender, non-distended with normoactive bowel sounds. No hepatosplenomegaly. Abdominal aorta is normal size without bruit Extremities: Trace edema, no clubbing, no cyanosis, no ulcers,  Peripheral: 2+ radial, 2+ femoral, 2+ dorsal pedal pulses Neuro: Alert and oriented. Moves all extremities spontaneously. Psych:  Responds to questions appropriately with a normal affect.   Intake/Output Summary (Last 24 hours) at 08/30/14 1723 Last data filed at 08/30/14 1530  Gross per 24 hour  Intake    720 ml  Output   1740  ml  Net  -1020 ml    Inpatient Medications:  . ALPRAZolam  0.25 mg Oral BH-q7a  . antiseptic oral rinse  7 mL Mouth Rinse BID  . cephALEXin  250 mg Oral Q12H  . furosemide  20 mg Intravenous Q12H  . levothyroxine  75 mcg Oral BH-q7a  . loratadine  10 mg Oral Daily  . pantoprazole  40 mg Oral Daily  . pramipexole  0.75 mg Oral QPM  . simvastatin  20 mg Oral Daily  . sodium chloride  3 mL Intravenous Q12H   Infusions:     Labs:  Recent Labs  08/28/14 0612  08/29/14 0632 08/30/14 0646  NA 135  < > 138 136  K 7.0*  < > 4.3 3.8  CL 104  < > 100* 98*  CO2 21*  < > 28 29  GLUCOSE 135*  < > 111* 117*  BUN 42*  < > 36* 34*  CREATININE 1.83*  < > 1.60* 1.49*  CALCIUM 9.9  < > 9.7 9.2  MG 2.2  --   --   --   < > = values in this interval not displayed. No results for input(s): AST, ALT, ALKPHOS, BILITOT, PROT, ALBUMIN in the last 72 hours.  Recent Labs  08/29/14 0632 08/30/14 0646  WBC 11.6* 7.1  HGB 9.3* 8.1*  HCT 30.2* 25.7*  MCV 77.1* 76.7*  PLT 197 153    Recent Labs  08/28/14 0612  TROPONINI 0.04*   Invalid input(s): POCBNP No results for input(s): HGBA1C in the last 72 hours.   Weights: Filed Weights   08/28/14 0602 08/28/14 0647 08/30/14 1550  Weight: 152 lb (68.947 kg) 145 lb (65.772 kg) 142 lb (64.411 kg)     Radiology/Studies:  Dg Chest  1 View  08/30/2014   CLINICAL DATA:  Shortness of breath  EXAM: CHEST  1 VIEW  COMPARISON:  08/29/2014  FINDINGS: There is cardiomegaly with vascular congestion. Diffuse interstitial prominence has slightly improved. There are small bilateral pleural effusions with bibasilar atelectasis.  IMPRESSION: Improving interstitial edema pattern.  Mild edema persists.  Small bilateral pleural effusions with bibasilar atelectasis.   Electronically Signed   By: Charlett Nose M.D.   On: 08/30/2014 09:20   Dg Chest 1 View  08/29/2014   CLINICAL DATA:  81 year old female with a history of dyspnea.  EXAM: CHEST  1 VIEW  COMPARISON:   08/28/2014, 03/13/2014  FINDINGS: Cardiomediastinal silhouette unchanged. Atherosclerotic calcifications of the aortic arch.  Defibrillator pads remain on the chest.  Mixed interstitial and airspace disease, with prominence of the central vasculature. Similar interlobular septal thickening. Left base not well evaluated.  No pneumothorax.  IMPRESSION: Persisting evidence of congestive heart failure, with mildly worsened edema and atelectasis. Cannot rule out left basilar pleural effusion.  Unchanged position of defibrillator pads.  Atherosclerosis.  Signed,  Yvone Neu. Loreta Ave, DO  [facility]  [facility] Radiology   Electronically Signed   By: Gilmer Mor D.O.   On: 08/29/2014 08:44   Dg Chest Port 1 View  08/28/2014   CLINICAL DATA:  Respiratory failure. Bradycardia, renal failure and congestive heart failure.  EXAM: PORTABLE CHEST - 1 VIEW  COMPARISON:  Film earlier today at 0600 hr.  FINDINGS: Worsening congestive heart failure present with more overt pulmonary edema now present. No significant pleural effusions. Stable moderate cardiomegaly. Pacing pads remain overlying the chest. No pneumothorax.  IMPRESSION: Worsening CHF.   Electronically Signed   By: Irish Lack M.D.   On: 08/28/2014 10:45   Dg Chest Port 1 View  08/28/2014   CLINICAL DATA:  Awoke with weakness and shortness of breath.  EXAM: PORTABLE CHEST - 1 VIEW  COMPARISON:  Frontal and lateral views 03/13/2014  FINDINGS: Cardiomegaly and atherosclerotic periaortic, unchanged. Mild prominent central vascular congestion. Blunting of right costophrenic angle is unchanged. No consolidation. No pneumothorax. Degenerative changes of both shoulders.  IMPRESSION: Stable cardiomegaly, prominent pulmonary vasculature, and small right pleural effusion. No definite acute process.   Electronically Signed   By: Rubye Oaks M.D.   On: 08/28/2014 06:34     Assessment and Recommendation  81 y.o. female with acute  renal failure causing hyperkalemia as well as significant bradycardia secondary to medication management with metoprolol and digoxin without evidence of congestive heart failure or myocardial infarction with stable aortic valve stenosis now converted to normal sinus rhythm with a reasonable heart rate control 1. Begin ambulation and follow for any significant atrial fibrillation with rapid ventricular rate 2. Possible reinstatement of medication management and/or the possibility of amiodarone use for heart rate control and maintenance of normal sinus rhythm without causing bradycardia although at this time would not add any medication management due to evidence of previous bradycardia 3. Continue hydration at this time for any renal failure 4. Avoid digoxin due to renal failure and bradycardia 5. No further cardiac diagnostics necessary at this time 6. Okay for discharge home from cardiac standpoint with follow-up next week  Signed, Arnoldo Hooker M.D. FACC

## 2014-08-30 NOTE — Consult Note (Signed)
[facility] Critical Care Medicine progress note  ASSESSMENT / PLAN: Volume overload -With pulmonary edema , now doing better  Symptomatic bradycardia -Likely due to digoxin toxicity, now appears to be improved . -She has been weaned off of her dopamine drip.  Respiratory failure -The patient is now doing better. She has been weaned off BiPAP and is currently on a nasal cannula and doing well.  Pulmonary edema -Will continue to monitor the patient appears to be improved. -This is likely multifactorial from acute renal failure as well as severe aortic stenosis.  Hyperkalemia -monitor.  Digitalis toxicity -The patient has already been given Digibind. -Will continue to monitor levels.  Aortic stenosis -Severe aortic stenosis.  Acute renal failure -The patient and family have opted for DO NOT RESUSCITATE status without dialysis. -Therefore we're managing conservatively, will continue to monitor her progress if her status does not improve will continue changing her status to comfort measures only.   Okay to transfer to general medical floor.  ---------------------------------------   Name: Claudia Warren MRN: [id number] DOB: September 13, 1917    ADMISSION DATE:  08/28/2014 CONSULTATION DATE:  08/28/14  REFERRING MD :  Alford Highland M.D  CHIEF COMPLAINT:  Dyspnea   HISTORY OF PRESENT ILLNESS:   The patient is awake and alert today. She appears to be much improved  FAMILY HISTORY:  Family History  Problem Relation Age of Onset  . CVA Mother   . CAD Father   . CAD Sister   . CAD Brother    REVIEW OF SYSTEMS:   Constitutional: Feels well. Cardiovascular: No chest pain.  Pulmonary: Denies dyspnea.   The remainder of systems were reviewed and were found to be negative other than what is documented in the HPI.    VITAL SIGNS: Temp:  [97.7 F (36.5 C)-98.3 F (36.8 C)] 97.9 F (36.6 C) (07/31 0700) Pulse Rate:  [51-102] 64 (07/31 1000) Resp:  [16-37] 24 (07/31  1000) BP: (82-152)/(36-133) 120/57 mmHg (07/31 1000) SpO2:  [82 %-100 %] 98 % (07/31 1000) HEMODYNAMICS:   VENTILATOR SETTINGS:   INTAKE / OUTPUT:  Intake/Output Summary (Last 24 hours) at 08/30/14 1044 Last data filed at 08/30/14 0900  Gross per 24 hour  Intake    600 ml  Output   1290 ml  Net   -690 ml    Physical Examination:   VS: BP 120/57 mmHg  Pulse 64  Temp(Src) 97.9 F (36.6 C) (Axillary)  Resp 24  Ht 5' (1.524 m)  Wt 65.772 kg (145 lb)  BMI 28.32 kg/m2  SpO2 98%  General Appearance: No distress  Neuro:without focal findings, mental status, speech normal, alert and oriented, cranial nerves 2-12 intact, reflexes normal and symmetric, sensation grossly normal  HEENT: PERRLA, EOM intact. Pulmonary: Bilateral crackles.  CardiovascularNormal S1,S2.  No m/r/g.  Abdomen: Benign, Soft, non-tender. Renal:  No costovertebral tenderness  GU:  No performed at this time. Endoc: No evident thyromegaly, Skin:   warm, no rashes, no ecchymosis  Extremities: normal, no cyanosis, clubbing.   LABS:  CBC  Recent Labs Lab 08/28/14 0612 08/29/14 0632 08/30/14 0646  WBC 6.8 11.6* 7.1  HGB 10.3* 9.3* 8.1*  HCT 33.0* 30.2* 25.7*  PLT 211 197 153   Coag's  Recent Labs Lab 08/28/14 0612 08/29/14 0632 08/30/14 0646  INR 2.94 3.35 3.16   BMET  Recent Labs Lab 08/28/14 1223 08/29/14 0632 08/30/14 0646  NA 138 138 136  K 5.0 4.3 3.8  CL 103 100* 98*  CO2 27  28 29  BUN 43* 36* 34*  CREATININE 1.66* 1.60* 1.49*  GLUCOSE 103* 111* 117*   Electrolytes  Recent Labs Lab 08/28/14 0612 08/28/14 1223 08/29/14 0632 08/30/14 0646  CALCIUM 9.9 11.2* 9.7 9.2  MG 2.2  --   --   --    Sepsis Markers No results for input(s): LATICACIDVEN, PROCALCITON, O2SATVEN in the last 168 hours. ABG No results for input(s): PHART, PCO2ART, PO2ART in the last 168 hours. Liver Enzymes No results for input(s): AST, ALT, ALKPHOS, BILITOT, ALBUMIN in the last 168 hours. Cardiac  Enzymes  Recent Labs Lab 08/28/14 0612  TROPONINI 0.04*   Glucose  Recent Labs Lab 08/28/14 0840  GLUCAP 167*    Imaging Dg Chest 1 View  08/30/2014   CLINICAL DATA:  Shortness of breath  EXAM: CHEST  1 VIEW  COMPARISON:  08/29/2014  FINDINGS: There is cardiomegaly with vascular congestion. Diffuse interstitial prominence has slightly improved. There are small bilateral pleural effusions with bibasilar atelectasis.  IMPRESSION: Improving interstitial edema pattern.  Mild edema persists.  Small bilateral pleural effusions with bibasilar atelectasis.   Electronically Signed   By: Charlett Nose M.D.   On: 08/30/2014 09:20    STUDIES:  CXR report and images reviewed these appear to be consistent with pulmonary edema and age-related emphysematous changes.  --Wells Guiles, MD.  Board Certified in Internal Medicine, Pulmonary Medicine, Critical Care Medicine, and Sleep Medicine.  Pager [phone] [facility] Office Number: [phone]  Santiago Glad, M.D.  Stephanie Acre, M.D.  Carolyne Fiscal, M.D   08/30/2014, 10:44 AM

## 2014-08-30 NOTE — Evaluation (Signed)
Physical Therapy Evaluation Patient Details Name: Claudia Warren MRN: [id number] DOB: January 24, 1918 Today's Date: 08/30/2014   History of Present Illness  Pt here with weakness, bradycardia  Clinical Impression  Pt generally does well regarding PT issues.  However her HR and O2 are issues with even 75 ft of ambulation.  She is very pleasant and though she is HOH she is able to participate very well with all acts.  She shows good effort and depending on progress with cardio/pulm issues may not need PT on d/c.    Follow Up Recommendations Home health PT (per continued activity tolerance issues)    Equipment Recommendations       Recommendations for Other Services       Precautions / Restrictions Precautions Precautions: Fall Restrictions Weight Bearing Restrictions: No      Mobility  Bed Mobility Overal bed mobility: Modified Independent             General bed mobility comments: uses rails, but does not need assist  Transfers Overall transfer level: Modified independent Equipment used: Rolling walker (2 wheeled)             General transfer comment: Pt is able to rise to standing w/o direct assist and generally shows good confidence.  Ambulation/Gait Ambulation/Gait assistance: Supervision Ambulation Distance (Feet): 75 Feet Assistive device: Rolling walker (2 wheeled)       General Gait Details: Pt is safe with ambulation showing good speed, confidence and no safety concerns.  She does have and increase in HR to 140s and on 2 liters of O2 her stats drop to low 90s  Stairs            Wheelchair Mobility    Modified Rankin (Stroke Patients Only)       Balance                                             Pertinent Vitals/Pain Pain Assessment: No/denies pain    Home Living Family/patient expects to be discharged to:: Assisted living                      Prior Function Level of Independence: Independent with assistive  device(s)         Comments: Pt does an exercises class 5d/week     Hand Dominance        Extremity/Trunk Assessment   Upper Extremity Assessment: Overall WFL for tasks assessed           Lower Extremity Assessment: Overall WFL for tasks assessed         Communication      Cognition Arousal/Alertness: Awake/alert Behavior During Therapy: WFL for tasks assessed/performed Overall Cognitive Status: Within Functional Limits for tasks assessed                      General Comments      Exercises        Assessment/Plan    PT Assessment Patient needs continued PT services  PT Diagnosis Generalized weakness;Difficulty walking   PT Problem List Decreased activity tolerance  PT Treatment Interventions Gait training;Therapeutic activities;Therapeutic exercise   PT Goals (Current goals can be found in the Care Plan section) Acute Rehab PT Goals Patient Stated Goal: "I just want to go back home" PT Goal Formulation: With patient/family Time For Goal Achievement: 09/13/14 Potential  to Achieve Goals: Good    Frequency Min 2X/week   Barriers to discharge        Co-evaluation               End of Session Equipment Utilized During Treatment: Gait belt Activity Tolerance: Patient limited by fatigue Patient left: with nursing/sitter in room;in bed           Time: 1349-1410 PT Time Calculation (min) (ACUTE ONLY): 21 min   Charges:   PT Evaluation $Initial PT Evaluation Tier I: 1 Procedure     PT G Codes:       Loran Senters, PT, DPT [phone]  Malachi Pro 08/30/2014, 4:39 PM

## 2014-08-31 LAB — BASIC METABOLIC PANEL
Anion gap: 7 (ref 5–15)
BUN: 33 mg/dL — ABNORMAL HIGH (ref 6–20)
CHLORIDE: 100 mmol/L — AB (ref 101–111)
CO2: 31 mmol/L (ref 22–32)
Calcium: 9.1 mg/dL (ref 8.9–10.3)
Creatinine, Ser: 1.19 mg/dL — ABNORMAL HIGH (ref 0.44–1.00)
GFR calc Af Amer: 44 mL/min — ABNORMAL LOW (ref >60)
GFR calc non Af Amer: 38 mL/min — ABNORMAL LOW (ref >60)
Glucose, Bld: 105 mg/dL — ABNORMAL HIGH (ref 65–99)
Potassium: 4.1 mmol/L (ref 3.5–5.1)
Sodium: 138 mmol/L (ref 135–145)

## 2014-08-31 LAB — PROTIME-INR
INR: 1.96
Prothrombin Time: 22.5 seconds — ABNORMAL HIGH (ref 11.4–15.0)

## 2014-08-31 LAB — IRON AND TIBC
Iron: 27 ug/dL — ABNORMAL LOW (ref 28–170)
Saturation Ratios: 7 % — ABNORMAL LOW (ref 10.4–31.8)
TIBC: 405 ug/dL (ref 250–450)
UIBC: 378 ug/dL

## 2014-08-31 LAB — CBC
HCT: 27.1 % — ABNORMAL LOW (ref 35.0–47.0)
Hemoglobin: 8.4 g/dL — ABNORMAL LOW (ref 12.0–16.0)
MCH: 23.9 pg — AB (ref 26.0–34.0)
MCHC: 31.2 g/dL — ABNORMAL LOW (ref 32.0–36.0)
MCV: 76.8 fL — ABNORMAL LOW (ref 80.0–100.0)
PLATELETS: 160 10*3/uL (ref 150–440)
RBC: 3.53 MIL/uL — ABNORMAL LOW (ref 3.80–5.20)
RDW: 18.9 % — ABNORMAL HIGH (ref 11.5–14.5)
WBC: 6.6 10*3/uL (ref 3.6–11.0)

## 2014-08-31 LAB — VITAMIN B12: VITAMIN B 12: 600 pg/mL (ref 180–914)

## 2014-08-31 LAB — FERRITIN: FERRITIN: 34 ng/mL (ref 11–307)

## 2014-08-31 MED ORDER — WARFARIN SODIUM 3 MG PO TABS
3.0000 mg | ORAL_TABLET | Freq: Every day | ORAL | Status: DC
  Administered 2014-08-31: 3 mg via ORAL
  Filled 2014-08-31: qty 1

## 2014-08-31 MED ORDER — SODIUM CHLORIDE 0.9 % IJ SOLN: 3.0000 mL | INTRAMUSCULAR | Status: DC | PRN

## 2014-08-31 MED ORDER — WARFARIN - PHYSICIAN DOSING INPATIENT: Freq: Every day | Status: DC

## 2014-08-31 NOTE — Progress Notes (Signed)
[facility]  ROUNDING NOTE   Subjective:   Transferred to telemetry. Breathing room air.  States she is feeling better.    Objective:  Vital signs in last 24 hours:  Temp:  [97.5 F (36.4 C)-98.2 F (36.8 C)] 98 F (36.7 C) (08/01 0718) Pulse Rate:  [54-115] 68 (08/01 0718) Resp:  [18-38] 20 (08/01 0718) BP: (99-124)/(45-69) 107/49 mmHg (08/01 0718) SpO2:  [89 %-98 %] 94 % (08/01 0718) Weight:  [64.411 kg (142 lb)] 64.411 kg (142 lb) (07/31 1550)  Weight change:  Filed Weights   08/28/14 0602 08/28/14 0647 08/30/14 1550  Weight: 68.947 kg (152 lb) 65.772 kg (145 lb) 64.411 kg (142 lb)    Intake/Output: I/O last 3 completed shifts: In: 483 [P.O.:480; I.V.:3] Out: 2190 [Urine:2190]   Intake/Output this shift:  Total I/O In: -  Out: 50 [Urine:50]  Physical Exam: General: NAD  Head: Normocephalic, atraumatic. Moist oral mucosal membranes  Eyes: Anicteric, PERRL  Neck: Supple, trachea midline  Lungs:  Bilateral crackles  Heart: Irregular, +murmur  Abdomen:  Soft, nontender,   Extremities:  trace peripheral edema.  Neurologic: Nonfocal, moving all four extremities  Skin: No lesions       Basic Metabolic Panel:  Recent Labs Lab 08/28/14 0612 08/28/14 1223 08/29/14 0632 08/30/14 0646 08/31/14 0444  NA 135 138 138 136 138  K 7.0* 5.0 4.3 3.8 4.1  CL 104 103 100* 98* 100*  CO2 21* 27 28 29 31   GLUCOSE 135* 103* 111* 117* 105*  BUN 42* 43* 36* 34* 33*  CREATININE 1.83* 1.66* 1.60* 1.49* 1.19*  CALCIUM 9.9 11.2* 9.7 9.2 9.1  MG 2.2  --   --   --   --     Liver Function Tests: No results for input(s): AST, ALT, ALKPHOS, BILITOT, PROT, ALBUMIN in the last 168 hours. No results for input(s): LIPASE, AMYLASE in the last 168 hours. No results for input(s): AMMONIA in the last 168 hours.  CBC:  Recent Labs Lab 08/28/14 0612 08/29/14 0632 08/30/14 0646 08/31/14 0444  WBC 6.8 11.6* 7.1 6.6  HGB 10.3* 9.3* 8.1* 8.4*  HCT 33.0* 30.2* 25.7*  27.1*  MCV 78.5* 77.1* 76.7* 76.8*  PLT 211 197 153 160    Cardiac Enzymes:  Recent Labs Lab 08/28/14 0612  TROPONINI 0.04*    BNP: Invalid input(s): POCBNP  CBG:  Recent Labs Lab 08/28/14 0840  GLUCAP 167*    Microbiology: Results for orders placed or performed during the hospital encounter of 08/28/14  MRSA PCR Screening     Status: None   Collection Time: 08/28/14  9:14 AM  Result Value Ref Range Status   MRSA by PCR NEGATIVE NEGATIVE Final    Comment:        The GeneXpert MRSA Assay (FDA approved for NASAL specimens only), is one component of a comprehensive MRSA colonization surveillance program. It is not intended to diagnose MRSA infection nor to guide or monitor treatment for MRSA infections.     Coagulation Studies:  Recent Labs  08/29/14 0632 08/30/14 0646  LABPROT 34.0* 32.5*  INR 3.35 3.16    Urinalysis: No results for input(s): COLORURINE, LABSPEC, PHURINE, GLUCOSEU, HGBUR, BILIRUBINUR, KETONESUR, PROTEINUR, UROBILINOGEN, NITRITE, LEUKOCYTESUR in the last 72 hours.  Invalid input(s): APPERANCEUR    Imaging: Dg Chest 1 View  08/30/2014   CLINICAL DATA:  Shortness of breath  EXAM: CHEST  1 VIEW  COMPARISON:  08/29/2014  FINDINGS: There is cardiomegaly with vascular congestion. Diffuse interstitial prominence  has slightly improved. There are small bilateral pleural effusions with bibasilar atelectasis.  IMPRESSION: Improving interstitial edema pattern.  Mild edema persists.  Small bilateral pleural effusions with bibasilar atelectasis.   Electronically Signed   By: Charlett Nose M.D.   On: 08/30/2014 09:20     Medications:     . ALPRAZolam  0.25 mg Oral BH-q7a  . antiseptic oral rinse  7 mL Mouth Rinse BID  . cephALEXin  250 mg Oral Q12H  . furosemide  20 mg Intravenous Q12H  . levothyroxine  75 mcg Oral BH-q7a  . loratadine  10 mg Oral Daily  . pantoprazole  40 mg Oral Daily  . pramipexole  0.75 mg Oral QPM  . simvastatin  20 mg  Oral Daily  . sodium chloride  3 mL Intravenous Q12H   acetaminophen **OR** acetaminophen, polyethylene glycol  Assessment/ Plan:  [name] is a 81 y.o. white female with congestive heart failure, atrial fibrillation, aortic stenosis, anxiety, hypertension, meniere's disease and hypothyroidism who was admitted to [facility] on 08/28/2014 for bradycardia  1. Acute renal failure with hyperkalemia: on chronic kidney disease stage III with baseline creatinine of 1. Acute renal failure and hyperkalemia seem to be secondary to acute exacerbation of congestive heart failure.  Hyperkalemia secondary to oral potassium supplements along with spironolactone. High tomato intake.  Nonoliguric urine output - Potassium now at goal. Creatinine trending down.  - Patient does not want dialysis.   2. Acute exacerbation of congestive heart failure with aortic valve stenosis and irregular heart rate: appreciate cardiology input. Outpatient cardiologist is Dr. Juel Burrow. Echocardiogram with aortic stenosis and diastolic dysfunction - furosemide  - free water restriction    LOS: 3 Claudia Warren 8/1/20169:20 AM

## 2014-08-31 NOTE — Progress Notes (Signed)
Assessment completed.  Cardiac monitor in place, pt denies chest pain. SL lt fa flushes well. No distress on 2LO2 per [place]. Lungs with scattered crackles, diminished lower lobes bil. Pt has 1+ generalized edema. Foley patent with yellow urine. Denies need, CB in reach, SR up x2 bed alarm on.

## 2014-08-31 NOTE — Clinical Social Work Note (Signed)
Clinical Social Work Assessment  Patient Details  Name: Claudia Warren MRN: [id number] Date of Birth: May 26, 1917  Date of referral:  08/31/14               Reason for consult:  Facility Placement                Permission sought to share information with:  Facility Medical sales representative, Family Supports Permission granted to share information::  Yes, Verbal Permission Granted  Name::     Claudia Warren [phone]/ Nile Dear [phone]  Agency::  SNFs  Relationship::     Contact Information:     Housing/Transportation Living arrangements for the past 2 months:  Independent Living Facility Source of Information:  Patient Patient Interpreter Needed:  None Criminal Activity/Legal Involvement Pertinent to Current Situation/Hospitalization:    Significant Relationships:  Neighbor, Church, Other Family Members Lives with:  Self Do you feel safe going back to the place where you live?  Yes Need for family participation in patient care:  Yes (Comment)  Care giving concerns:  Pt lives alone. Has strong social support.    Social Worker assessment / plan:  Clinical Social Worker was referred to Pt to assist with dc planning. Pt is widowed, has lost both of her adult children to cancer, lives in an independent living community. 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 Pt is aware of the recommendation of SNF for STR. She has been to [facility] in the past several years ago. Pt is agreeable to CSW doing SNF bed search and following up with her niece/HCPOA Claudia Warren.   FL2 completed and faxed out.   Employment status:  Retired Database administrator PT Recommendations:  Skilled Nursing Facility Information / Referral to community resources:  Skilled Nursing Facility  Patient/Family's Response to care:  Pt's family is very supportive of Pt and in agreement with SNF for STR. Pt's niece Nile Dear was in the room during the assessment,  along with some close friends.   Patient/Family's Understanding of and Emotional Response to Diagnosis, Current Treatment, and Prognosis:  Pt is calm and realistic about her illness. She states that she believes a change in medication has caused additional problems but she is not anxious about the medication getting fixed.   Emotional Assessment Appearance:  Appears younger than stated age Attitude/Demeanor/Rapport:   (positive) Affect (typically observed):  Adaptable, Accepting, Calm Orientation:  Oriented to Self, Oriented to Place, Oriented to  Time, Oriented to Situation Alcohol / Substance use:  Never Used Psych involvement (Current and /or in the community):  No (Comment)  Discharge Needs  Concerns to be addressed:  Discharge Planning Concerns Readmission within the last 30 days:  No Current discharge risk:  Dependent with Mobility Barriers to Discharge:  Continued Medical Work up   [employer], LCSW 08/31/2014, 1:13 PM

## 2014-08-31 NOTE — Progress Notes (Signed)
Pulm/CCM has little further to add and will sign off. Please call if we can be of further assistance  Billy Fischer, MD ; [facility] [phone].

## 2014-08-31 NOTE — Care Management (Signed)
This [age] year old who appears younger than her stated years presents from [facility].  She has been a resident for the past 18 months and has a very active social life.  She does have a walker with a basket "to carry my things" and have confirmed her 02 through [facility] is ordered for nocturnal use.   She has chronic kidney disease  And presents with exac of chf, high potasium, acute on chronic renal failure.  She has known aortic stenosis.  Physical therapy initially recommended patient could discharge back to [facility] but now recommends short term skilled nursing and patient is in agreement. CSW aware.  Patient is receiving iv lasix.  Potassium and renal status is improving.  Would anticipate could discharge within 24 - 48 hours if continues to improve.  Patient is also in agreement with home health nurse follow up when dischrges back to [facility].  Referral called to [facility].  Patient will need to be assessed  for continuous home 02 upon discharge from the skilled nursing

## 2014-08-31 NOTE — Progress Notes (Signed)
Physical Therapy Treatment Patient Details Name: Claudia Warren MRN: [id number] DOB: Dec 14, 1917 Today's Date: 08/31/2014    History of Present Illness Pt is a 81 year-old female admitted to the hospital for SOB and weakness.     PT Comments    Pt not performing mobility as well today, possibly due to bilateral LE pain that she complained of. She mentioned the desire to go to a rehab facility because of her activity tolerance. She currently cannot successfully ambulate the distances that she needs to at home by herself. Pt will continue to benefit from skilled PT in order to return home safe eventually.  Follow Up Recommendations  SNF     Equipment Recommendations  Rolling walker with 5" wheels    Recommendations for Other Services       Precautions / Restrictions Precautions Precautions: Fall Restrictions Weight Bearing Restrictions: No    Mobility  Bed Mobility Overal bed mobility: Needs Assistance Bed Mobility: Supine to Sit     Supine to sit: Min assist     General bed mobility comments: needs slight assist to get to EOB  Transfers Overall transfer level: Needs assistance Equipment used: Rolling walker (2 wheeled) Transfers: Sit to/from Stand Sit to Stand: Min assist         General transfer comment: Pt needs bodyweight support to get to standing but does not show any LOB   Ambulation/Gait Ambulation/Gait assistance: Min guard Ambulation Distance (Feet): 45 Feet Assistive device: Rolling walker (2 wheeled) Gait Pattern/deviations: Step-to pattern;Decreased step length - right;Decreased step length - left;Decreased stride length;Steppage     General Gait Details: Pt struggled to sequence RW with gait and had decreased activity tolerance this date possibly secondary to bilateral LE pain.    Stairs            Wheelchair Mobility    Modified Rankin (Stroke Patients Only)       Balance Overall balance assessment: No apparent balance deficits  (not formally assessed)                                  Cognition Arousal/Alertness: Awake/alert Behavior During Therapy: WFL for tasks assessed/performed Overall Cognitive Status: Within Functional Limits for tasks assessed       Memory:  (decreased recall of home environment)              Exercises Other Exercises Other Exercises: Pt performed therapeutic exercise x15 reps with supervision for proper technique. On bilateral LE: LAQ, SLR, ankle pumps, and heel slides. Pt requires lots of cueing for attending to task.      General Comments General comments (skin integrity, edema, etc.): Pt notes bilateral distal LE pain. Negative Homan's sign.      Pertinent Vitals/Pain Pain Assessment:  (Mentions pain in bilateral feet)    Home Living                      Prior Function            PT Goals (current goals can now be found in the care plan section) Acute Rehab PT Goals Patient Stated Goal: i want to go to rehab PT Goal Formulation: With patient/family Time For Goal Achievement: 09/13/14 Potential to Achieve Goals: Good Progress towards PT goals: PT to reassess next treatment    Frequency  Min 2X/week    PT Plan Discharge plan needs to be updated  Co-evaluation             End of Session Equipment Utilized During Treatment: Gait belt Activity Tolerance: Patient limited by pain Patient left: in chair;with call bell/phone within reach;with chair alarm set;with family/visitor present     Time: 4098-1191 PT Time Calculation (min) (ACUTE ONLY): 28 min  Charges:                       G CodesBenna Dunks 2014-09-24, 12:51 PM  Benna Dunks, SPT. [phone]

## 2014-08-31 NOTE — Care Management Important Message (Signed)
Important Message  Patient Details  Name: ANSHI JALLOH MRN: [id number] Date of Birth: 1917/08/15   Medicare Important Message Given:  Yes-second notification given    Olegario Messier A Allmond 08/31/2014, 10:09 AM

## 2014-08-31 NOTE — Progress Notes (Signed)
[facility] [place] at [facility]                                                                                                                                                                                            Patient Demographics   Claudia Warren, is a 81 y.o. female, DOB - [birth year], ZOX:[id number]  Admit date - 08/28/2014   Admitting Physician Alford Highland, MD  Outpatient Primary MD for the patient is No primary care provider on file.   LOS - 3  Subjective: Patient's breathing has improved, her renal function is improved as well   Review of Systems:   CONSTITUTIONAL: No documented fever. No fatigue, weakness. No weight gain, no weight loss.  EYES: No blurry or double vision.  ENT: No tinnitus. No postnasal drip. No redness of the oropharynx.  RESPIRATORY: No cough, no wheeze, no hemoptysis. Positive dyspnea.  CARDIOVASCULAR: No chest pain. No orthopnea. No palpitations. No syncope.  GASTROINTESTINAL: No nausea, no vomiting or diarrhea. No abdominal pain. No melena or hematochezia.  GENITOURINARY: No dysuria or hematuria.  ENDOCRINE: No polyuria or nocturia. No heat or cold intolerance.  HEMATOLOGY: No anemia. No bruising. No bleeding.  INTEGUMENTARY: No rashes. No lesions.  MUSCULOSKELETAL: No arthritis. No swelling. No gout.  NEUROLOGIC: No numbness, tingling, or ataxia. No seizure-type activity.  PSYCHIATRIC: No anxiety. No insomnia. No ADD.    Vitals:   Filed Vitals:   08/30/14 1550 08/30/14 2029 08/31/14 0425 08/31/14 0718  BP: 120/45 115/51 116/50 107/49  Pulse: 72 74 69 68  Temp: 98 F (36.7 C) 97.5 F (36.4 C) 97.9 F (36.6 C) 98 F (36.7 C)  TempSrc: Oral Oral  Oral  Resp: 22 18 20 20   Height: 5' (1.524 m)     Weight: 64.411 kg (142 lb)     SpO2: 98% 97% 97% 94%    Wt Readings from Last 3 Encounters:  08/30/14 64.411 kg (142 lb)     Intake/Output Summary (Last 24 hours) at 08/31/14 1138 Last data filed at  08/31/14 0925  Gross per 24 hour  Intake    483 ml  Output   1550 ml  Net  -1067 ml    Physical Exam:   GENERAL: Critically ill.  HEAD, EYES, EARS, NOSE AND THROAT: Atraumatic, normocephalic. Extraocular muscles are intact. Pupils equal and reactive to light. Sclerae anicteric. No conjunctival injection. No oro-pharyngeal erythema.  NECK: Supple. There is no jugular venous distention. No bruits, no lymphadenopathy, no thyromegaly.  HEART: Regular rate and rhythm, tachycardic. No murmurs, no rubs, no clicks.  LUNGS: Clear to auscultation bilaterally. No rales  or rhonchi. No wheezes.  ABDOMEN: Soft, flat, nontender, nondistended. Has good bowel sounds. No hepatosplenomegaly appreciated.  EXTREMITIES: No evidence of any cyanosis, clubbing, or peripheral edema.  +2 pedal and radial pulses bilaterally.  NEUROLOGIC: The patient is alert, awake, and oriented x3 with no focal motor or sensory deficits appreciated bilaterally.  SKIN: Moist and warm with no rashes appreciated.  Psych: Not anxious, depressed LN: No inguinal LN enlargement    Antibiotics   Anti-infectives    Start     Dose/Rate Route Frequency Ordered Stop   08/28/14 1000  cephALEXin (KEFLEX) capsule 250 mg     250 mg Oral Every 12 hours 08/28/14 0856        Medications   Scheduled Meds: . ALPRAZolam  0.25 mg Oral BH-q7a  . antiseptic oral rinse  7 mL Mouth Rinse BID  . cephALEXin  250 mg Oral Q12H  . furosemide  20 mg Intravenous Q12H  . levothyroxine  75 mcg Oral BH-q7a  . loratadine  10 mg Oral Daily  . pantoprazole  40 mg Oral Daily  . pramipexole  0.75 mg Oral QPM  . simvastatin  20 mg Oral Daily  . sodium chloride  3 mL Intravenous Q12H  . warfarin  3 mg Oral q1800   Continuous Infusions:  PRN Meds:.acetaminophen **OR** acetaminophen, polyethylene glycol   Data Review:   Micro Results Recent Results (from the past 240 hour(s))  MRSA PCR Screening     Status: None   Collection Time: 08/28/14  9:14 AM   Result Value Ref Range Status   MRSA by PCR NEGATIVE NEGATIVE Final    Comment:        The GeneXpert MRSA Assay (FDA approved for NASAL specimens only), is one component of a comprehensive MRSA colonization surveillance program. It is not intended to diagnose MRSA infection nor to guide or monitor treatment for MRSA infections.     Radiology Reports Dg Chest 1 View  08/30/2014   CLINICAL DATA:  Shortness of breath  EXAM: CHEST  1 VIEW  COMPARISON:  08/29/2014  FINDINGS: There is cardiomegaly with vascular congestion. Diffuse interstitial prominence has slightly improved. There are small bilateral pleural effusions with bibasilar atelectasis.  IMPRESSION: Improving interstitial edema pattern.  Mild edema persists.  Small bilateral pleural effusions with bibasilar atelectasis.   Electronically Signed   By: Charlett Nose M.D.   On: 08/30/2014 09:20   Dg Chest 1 View  08/29/2014   CLINICAL DATA:  81 year old female with a history of dyspnea.  EXAM: CHEST  1 VIEW  COMPARISON:  08/28/2014, 03/13/2014  FINDINGS: Cardiomediastinal silhouette unchanged. Atherosclerotic calcifications of the aortic arch.  Defibrillator pads remain on the chest.  Mixed interstitial and airspace disease, with prominence of the central vasculature. Similar interlobular septal thickening. Left base not well evaluated.  No pneumothorax.  IMPRESSION: Persisting evidence of congestive heart failure, with mildly worsened edema and atelectasis. Cannot rule out left basilar pleural effusion.  Unchanged position of defibrillator pads.  Atherosclerosis.  Signed,  Yvone Neu. Loreta Ave, DO  [facility]  [facility]   Electronically Signed   By: Gilmer Mor D.O.   On: 08/29/2014 08:44   Dg Chest Port 1 View  08/28/2014   CLINICAL DATA:  Respiratory failure. Bradycardia, renal failure and congestive heart failure.  EXAM: PORTABLE CHEST - 1 VIEW  COMPARISON:  Film earlier today at 0600 hr.   FINDINGS: Worsening congestive heart failure present with more overt pulmonary edema now present.  No significant pleural effusions. Stable moderate cardiomegaly. Pacing pads remain overlying the chest. No pneumothorax.  IMPRESSION: Worsening CHF.   Electronically Signed   By: Irish Lack M.D.   On: 08/28/2014 10:45   Dg Chest Port 1 View  08/28/2014   CLINICAL DATA:  Awoke with weakness and shortness of breath.  EXAM: PORTABLE CHEST - 1 VIEW  COMPARISON:  Frontal and lateral views 03/13/2014  FINDINGS: Cardiomegaly and atherosclerotic periaortic, unchanged. Mild prominent central vascular congestion. Blunting of right costophrenic angle is unchanged. No consolidation. No pneumothorax. Degenerative changes of both shoulders.  IMPRESSION: Stable cardiomegaly, prominent pulmonary vasculature, and small right pleural effusion. No definite acute process.   Electronically Signed   By: Rubye Oaks M.D.   On: 08/28/2014 06:34     CBC  Recent Labs Lab 08/28/14 0612 08/29/14 0632 08/30/14 0646 08/31/14 0444  WBC 6.8 11.6* 7.1 6.6  HGB 10.3* 9.3* 8.1* 8.4*  HCT 33.0* 30.2* 25.7* 27.1*  PLT 211 197 153 160  MCV 78.5* 77.1* 76.7* 76.8*  MCH 24.5* 23.6* 24.1* 23.9*  MCHC 31.2* 30.6* 31.4* 31.2*  RDW 19.2* 19.2* 19.0* 18.9*    Chemistries   Recent Labs Lab 08/28/14 0612 08/28/14 1223 08/29/14 0632 08/30/14 0646 08/31/14 0444  NA 135 138 138 136 138  K 7.0* 5.0 4.3 3.8 4.1  CL 104 103 100* 98* 100*  CO2 21* 27 28 29 31   GLUCOSE 135* 103* 111* 117* 105*  BUN 42* 43* 36* 34* 33*  CREATININE 1.83* 1.66* 1.60* 1.49* 1.19*  CALCIUM 9.9 11.2* 9.7 9.2 9.1  MG 2.2  --   --   --   --    ------------------------------------------------------------------------------------------------------------------ estimated creatinine clearance is 23.7 mL/min (by C-G formula based on Cr of  1.19). ------------------------------------------------------------------------------------------------------------------ No results for input(s): HGBA1C in the last 72 hours. ------------------------------------------------------------------------------------------------------------------ No results for input(s): CHOL, HDL, LDLCALC, TRIG, CHOLHDL, LDLDIRECT in the last 72 hours. ------------------------------------------------------------------------------------------------------------------ No results for input(s): TSH, T4TOTAL, T3FREE, THYROIDAB in the last 72 hours.  Invalid input(s): FREET3 ------------------------------------------------------------------------------------------------------------------  Recent Labs  08/31/14 0958  FERRITIN 34  TIBC 405  IRON 27*    Coagulation profile  Recent Labs Lab 08/28/14 0612 08/29/14 0632 08/30/14 0646  INR 2.94 3.35 3.16    No results for input(s): DDIMER in the last 72 hours.  Cardiac Enzymes  Recent Labs Lab 08/28/14 0612  TROPONINI 0.04*   ------------------------------------------------------------------------------------------------------------------ Invalid input(s): POCBNP    Assessment & Plan   1. Symptomatic bradycardia with junctional rhythm. Due to combination of digoxin toxicity hyperkalemia and metoprolol. Status post treatment with Digibind. Heart rate is currently stable.  2. Severe hyperkalemia and acute renal failure. Not resolved 3. History of atrial fibrillation-  resume Coumadin  4. Acute on chronic diastolic CHF  continue low-dose IV Lasix patient seems to be responding 5. Hyperlipidemia unspecified continue simvastatin. 6. Gastroesophageal reflux disease without esophagitis on omeprazole at home will continue Protonix here. 7. Anxiety continue Xanax. 8. Severe aortic stenosis at her age not a good candidate for any type of surgical intervention 9. Lower extremity erythema- improved with oral  antabiotic's 10 restless leg syndrome      Code Status Orders        Start     Ordered   08/28/14 0804  Do not attempt resuscitation (DNR)   Continuous    Question Answer Comment  In the event of cardiac or respiratory ARREST Do not call a "code blue"   In the event of cardiac or  respiratory ARREST Do not perform Intubation, CPR, defibrillation or ACLS   In the event of cardiac or respiratory ARREST Use medication by any route, position, wound care, and other measures to relive pain and suffering. May use oxygen, suction and manual treatment of airway obstruction as needed for comfort.   Comments nurse may pronounce      08/28/14 0804    Advance Directive Documentation        Most Recent Value   Type of Advance Directive  -- [pt states she has a DNR but not provided by facility at this time.]   Pre-existing out of facility DNR order (yellow form or pink MOST form)     "MOST" Form in Place?             Consults cardiology   DVT ProphylaxisSCDs   Lab Results  Component Value Date   PLT 160 08/31/2014     Time Spent in minutes   25 minutes    Auburn Bilberry M.D on 08/31/2014 at 11:38 AM  Between 7am to 6pm - Pager - [phone]  After 6pm go to [email or web address] - password [email or web address] [facility]  [facility]   Office  [phone]

## 2014-08-31 NOTE — Clinical Social Work Placement (Signed)
   CLINICAL SOCIAL WORK PLACEMENT  NOTE  Date:  08/31/2014  Patient Details  Name: Claudia Warren MRN: [id number] Date of Birth: [birth year]  Clinical Social Work is seeking post-discharge placement for this patient at the Skilled  Nursing Facility level of care (*CSW will initial, date and re-position this form in  chart as items are completed):  Yes   Patient/family provided with [facility] Clinical Social Work Department's list of facilities offering this level of care within the geographic area requested by the patient (or if unable, by the patient's family).  Yes   Patient/family informed of their freedom to choose among providers that offer the needed level of care, that participate in Medicare, Medicaid or managed care program needed by the patient, have an available bed and are willing to accept the patient.  Yes   Patient/family informed of [facility]'s ownership interest in [facility] and [facility], as well as of the fact that they are under no obligation to receive care at these facilities.  PASRR submitted to EDS on       PASRR number received on       Existing PASRR number confirmed on 08/31/14     FL2 transmitted to all facilities in geographic area requested by pt/family on 08/31/14     FL2 transmitted to all facilities within larger geographic area on       Patient informed that his/her managed care company has contracts with or will negotiate with certain facilities, including the following:        Yes   Patient/family informed of bed offers received.  Patient chooses bed at [facility]     Physician recommends and patient chooses bed at      Patient to be transferred to [facility] on  .  Patient to be transferred to facility by       Patient family notified on   of transfer.  Name of family member notified:        PHYSICIAN       Additional Comment:    _______________________________________________ Ned Card, LCSW 08/31/2014,  1:53 PM

## 2014-09-01 ENCOUNTER — Encounter
Admission: RE | Admit: 2014-09-01 | Discharge: 2014-09-01 | Disposition: A | Discharge status: Home / Self Care | Payer: Medicare Other | Source: Ambulatory Visit | Attending: Internal Medicine | Admitting: Internal Medicine

## 2014-09-01 DIAGNOSIS — F419 Anxiety disorder, unspecified: Secondary | ICD-10-CM | POA: Diagnosis not present

## 2014-09-01 DIAGNOSIS — M109 Gout, unspecified: Secondary | ICD-10-CM | POA: Diagnosis not present

## 2014-09-01 DIAGNOSIS — I10 Essential (primary) hypertension: Secondary | ICD-10-CM | POA: Diagnosis not present

## 2014-09-01 DIAGNOSIS — Z9981 Dependence on supplemental oxygen: Secondary | ICD-10-CM | POA: Diagnosis not present

## 2014-09-01 DIAGNOSIS — T460X5D Adverse effect of cardiac-stimulant glycosides and drugs of similar action, subsequent encounter: Secondary | ICD-10-CM | POA: Diagnosis not present

## 2014-09-01 DIAGNOSIS — I4891 Unspecified atrial fibrillation: Secondary | ICD-10-CM | POA: Diagnosis not present

## 2014-09-01 DIAGNOSIS — Z5181 Encounter for therapeutic drug level monitoring: Secondary | ICD-10-CM | POA: Diagnosis not present

## 2014-09-01 DIAGNOSIS — E039 Hypothyroidism, unspecified: Secondary | ICD-10-CM | POA: Diagnosis not present

## 2014-09-01 DIAGNOSIS — D649 Anemia, unspecified: Secondary | ICD-10-CM | POA: Diagnosis not present

## 2014-09-01 DIAGNOSIS — E875 Hyperkalemia: Secondary | ICD-10-CM | POA: Diagnosis not present

## 2014-09-01 DIAGNOSIS — Z7901 Long term (current) use of anticoagulants: Secondary | ICD-10-CM | POA: Diagnosis not present

## 2014-09-01 DIAGNOSIS — N183 Chronic kidney disease, stage 3 (moderate): Secondary | ICD-10-CM | POA: Diagnosis not present

## 2014-09-01 DIAGNOSIS — L539 Erythematous condition, unspecified: Secondary | ICD-10-CM | POA: Diagnosis not present

## 2014-09-01 DIAGNOSIS — R001 Bradycardia, unspecified: Secondary | ICD-10-CM | POA: Diagnosis not present

## 2014-09-01 DIAGNOSIS — M6281 Muscle weakness (generalized): Secondary | ICD-10-CM | POA: Diagnosis not present

## 2014-09-01 DIAGNOSIS — I5032 Chronic diastolic (congestive) heart failure: Secondary | ICD-10-CM | POA: Diagnosis not present

## 2014-09-01 DIAGNOSIS — N179 Acute kidney failure, unspecified: Secondary | ICD-10-CM | POA: Diagnosis not present

## 2014-09-01 DIAGNOSIS — I350 Nonrheumatic aortic (valve) stenosis: Secondary | ICD-10-CM | POA: Diagnosis not present

## 2014-09-01 DIAGNOSIS — K219 Gastro-esophageal reflux disease without esophagitis: Secondary | ICD-10-CM | POA: Diagnosis not present

## 2014-09-01 DIAGNOSIS — E785 Hyperlipidemia, unspecified: Secondary | ICD-10-CM | POA: Diagnosis not present

## 2014-09-01 DIAGNOSIS — E876 Hypokalemia: Secondary | ICD-10-CM | POA: Diagnosis not present

## 2014-09-01 DIAGNOSIS — G2581 Restless legs syndrome: Secondary | ICD-10-CM | POA: Diagnosis not present

## 2014-09-01 DIAGNOSIS — I5031 Acute diastolic (congestive) heart failure: Secondary | ICD-10-CM | POA: Diagnosis not present

## 2014-09-01 DIAGNOSIS — T460X1A Poisoning by cardiac-stimulant glycosides and drugs of similar action, accidental (unintentional), initial encounter: Secondary | ICD-10-CM | POA: Diagnosis not present

## 2014-09-01 LAB — CBC WITH DIFFERENTIAL/PLATELET
BASOS PCT: 1 %
Basophils Absolute: 0 10*3/uL (ref 0–0.1)
EOS ABS: 0.2 10*3/uL (ref 0–0.7)
Eosinophils Relative: 3 %
HEMATOCRIT: 27.3 % — AB (ref 35.0–47.0)
Hemoglobin: 8.3 g/dL — ABNORMAL LOW (ref 12.0–16.0)
LYMPHS ABS: 0.6 10*3/uL — AB (ref 1.0–3.6)
LYMPHS PCT: 12 %
MCH: 23.6 pg — AB (ref 26.0–34.0)
MCHC: 30.4 g/dL — ABNORMAL LOW (ref 32.0–36.0)
MCV: 77.6 fL — AB (ref 80.0–100.0)
MONO ABS: 0.5 10*3/uL (ref 0.2–0.9)
Monocytes Relative: 10 %
NEUTROS ABS: 3.7 10*3/uL (ref 1.4–6.5)
Neutrophils Relative %: 74 %
Platelets: 173 10*3/uL (ref 150–440)
RBC: 3.52 MIL/uL — ABNORMAL LOW (ref 3.80–5.20)
RDW: 19.5 % — ABNORMAL HIGH (ref 11.5–14.5)
WBC: 5 10*3/uL (ref 3.6–11.0)

## 2014-09-01 LAB — BASIC METABOLIC PANEL
Anion gap: 8 (ref 5–15)
BUN: 32 mg/dL — ABNORMAL HIGH (ref 6–20)
CHLORIDE: 97 mmol/L — AB (ref 101–111)
CO2: 32 mmol/L (ref 22–32)
CREATININE: 1.09 mg/dL — AB (ref 0.44–1.00)
Calcium: 9.2 mg/dL (ref 8.9–10.3)
GFR calc Af Amer: 48 mL/min — ABNORMAL LOW (ref >60)
GFR, EST NON AFRICAN AMERICAN: 42 mL/min — AB (ref >60)
Glucose, Bld: 95 mg/dL (ref 65–99)
POTASSIUM: 3.9 mmol/L (ref 3.5–5.1)
SODIUM: 137 mmol/L (ref 135–145)

## 2014-09-01 LAB — PROTIME-INR
INR: 1.84
Prothrombin Time: 21.4 seconds — ABNORMAL HIGH (ref 11.4–15.0)

## 2014-09-01 LAB — FOLATE RBC
Folate, Hemolysate: 486.1 ng/mL
Folate, RBC: 1781 ng/mL (ref >498)
Hematocrit: 27.3 % — ABNORMAL LOW (ref 34.0–46.6)

## 2014-09-01 MED ORDER — LACTULOSE 10 GM/15ML PO SOLN
30.0000 g | Freq: Once | ORAL | Status: AC
  Administered 2014-09-01: 30 g via ORAL
  Filled 2014-09-01: qty 60

## 2014-09-01 MED ORDER — DOCUSATE SODIUM 100 MG PO CAPS
200.0000 mg | ORAL_CAPSULE | Freq: Two times a day (BID) | ORAL | Status: DC
  Administered 2014-09-01: 200 mg via ORAL
  Filled 2014-09-01: qty 2

## 2014-09-01 MED ORDER — CEPHALEXIN 250 MG PO CAPS: 250.0000 mg | ORAL_CAPSULE | Freq: Two times a day (BID) | ORAL | Status: AC

## 2014-09-01 MED ORDER — POLYETHYLENE GLYCOL 3350 17 G PO PACK: 17.0000 g | PACK | Freq: Every day | ORAL | Status: AC | PRN

## 2014-09-01 MED ORDER — DOCUSATE SODIUM 100 MG PO CAPS: 200.0000 mg | ORAL_CAPSULE | Freq: Two times a day (BID) | ORAL | Status: AC

## 2014-09-01 MED ORDER — ALPRAZOLAM 0.25 MG PO TABS: 0.2500 mg | ORAL_TABLET | ORAL | Status: DC

## 2014-09-01 NOTE — Progress Notes (Signed)
CSW was notified that Pt has been medically cleared for DC to [facility]. Pt's niece will be providing transport at DC. [facility] will provide 02 tank. CSW prepared dc packet and sent dc summary to SNF.  RN to call report.   Wilford Grist, LCSW [phone]

## 2014-09-01 NOTE — Progress Notes (Signed)
Foley catheter removed per md orders, pt tolerated well

## 2014-09-01 NOTE — Progress Notes (Signed)
Pt discharged to [facility] via wc niece.  Instructions and rx given to pt.  Questions answered.  No distress.

## 2014-09-01 NOTE — Progress Notes (Signed)
[facility]  ROUNDING NOTE   Subjective:   Plan to be discharged later today.    Objective:  Vital signs in last 24 hours:  Temp:  [97.4 F (36.3 C)-97.7 F (36.5 C)] 97.6 F (36.4 C) (08/02 0752) Pulse Rate:  [50-68] 68 (08/02 0752) Resp:  [16-20] 18 (08/02 0752) BP: (106-127)/(46-63) 123/53 mmHg (08/02 0752) SpO2:  [79 %-100 %] 79 % (08/02 0752)  Weight change:  Filed Weights   08/28/14 0602 08/28/14 0647 08/30/14 1550  Weight: 68.947 kg (152 lb) 65.772 kg (145 lb) 64.411 kg (142 lb)    Intake/Output: I/O last 3 completed shifts: In: 723 [P.O.:720; I.V.:3] Out: 2300 [Urine:2300]   Intake/Output this shift:  Total I/O In: 540 [P.O.:240; Other:300] Out: 450 [Urine:450]  Physical Exam: General: NAD  Head: Normocephalic, atraumatic. Moist oral mucosal membranes  Eyes: Anicteric, PERRL  Neck: Supple, trachea midline  Lungs:  Bilateral crackles  Heart: Irregular, +murmur  Abdomen:  Soft, nontender,   Extremities:  trace peripheral edema.  Neurologic: Nonfocal, moving all four extremities  Skin: No lesions       Basic Metabolic Panel:  Recent Labs Lab 08/28/14 0612 08/28/14 1223 08/29/14 0632 08/30/14 0646 08/31/14 0444 09/01/14 0425  NA 135 138 138 136 138 137  K 7.0* 5.0 4.3 3.8 4.1 3.9  CL 104 103 100* 98* 100* 97*  CO2 21* 32  GLUCOSE 135* 103* 111* 117* 105* 95  BUN 42* 43* 36* 34* 33* 32*  CREATININE 1.83* 1.66* 1.60* 1.49* 1.19* 1.09*  CALCIUM 9.9 11.2* 9.7 9.2 9.1 9.2  MG 2.2  --   --   --   --   --     Liver Function Tests: No results for input(s): AST, ALT, ALKPHOS, BILITOT, PROT, ALBUMIN in the last 168 hours. No results for input(s): LIPASE, AMYLASE in the last 168 hours. No results for input(s): AMMONIA in the last 168 hours.  CBC:  Recent Labs Lab 08/28/14 0612 08/29/14 1610 08/30/14 0646 08/31/14 0444 09/01/14 0425  WBC 6.8 11.6* 7.1 6.6 5.0  NEUTROABS  --   --   --   --  3.7  HGB 10.3* 9.3* 8.1*  8.4* 8.3*  HCT 33.0* 30.2* 25.7* 27.1* 27.3*  MCV 78.5* 77.1* 76.7* 76.8* 77.6*  PLT 211 197 153 160 173    Cardiac Enzymes:  Recent Labs Lab 08/28/14 0612  TROPONINI 0.04*    BNP: Invalid input(s): POCBNP  CBG:  Recent Labs Lab 08/28/14 0840  GLUCAP 167*    Microbiology: Results for orders placed or performed during the hospital encounter of 08/28/14  MRSA PCR Screening     Status: None   Collection Time: 08/28/14  9:14 AM  Result Value Ref Range Status   MRSA by PCR NEGATIVE NEGATIVE Final    Comment:        The GeneXpert MRSA Assay (FDA approved for NASAL specimens only), is one component of a comprehensive MRSA colonization surveillance program. It is not intended to diagnose MRSA infection nor to guide or monitor treatment for MRSA infections.     Coagulation Studies:  Recent Labs  08/30/14 0646 08/31/14 1111 09/01/14 0425  LABPROT 32.5* 22.5* 21.4*  INR 3.16 1.96 1.84    Urinalysis: No results for input(s): COLORURINE, LABSPEC, PHURINE, GLUCOSEU, HGBUR, BILIRUBINUR, KETONESUR, PROTEINUR, UROBILINOGEN, NITRITE, LEUKOCYTESUR in the last 72 hours.  Invalid input(s): APPERANCEUR    Imaging: No results found.   Medications:     . ALPRAZolam  0.25 mg Oral BH-q7a  . cephALEXin  250 mg Oral Q12H  . docusate sodium  200 mg Oral BID  . furosemide  20 mg Intravenous Q12H  . levothyroxine  75 mcg Oral BH-q7a  . loratadine  10 mg Oral Daily  . pantoprazole  40 mg Oral Daily  . pramipexole  0.75 mg Oral QPM  . simvastatin  20 mg Oral Daily  . warfarin  3 mg Oral q1800  . Warfarin - Physician Dosing Inpatient   Does not apply q1800   acetaminophen **OR** acetaminophen, polyethylene glycol, sodium chloride  Assessment/ Plan:  [name] is a 81 y.o. white female with congestive heart failure, atrial fibrillation, aortic stenosis, anxiety, hypertension, meniere's disease and hypothyroidism who was admitted to [facility] on 08/28/2014 for  bradycardia  1. Acute renal failure with hyperkalemia: on chronic kidney disease stage III with baseline creatinine of 1. Acute renal failure and hyperkalemia seem to be secondary to acute exacerbation of congestive heart failure. Creatinine trending down.  Hyperkalemia secondary to oral potassium supplements along with spironolactone. High tomato intake.  Nonoliguric urine output - Potassium now at goal.  - Patient does not want dialysis.   2. Acute exacerbation of congestive heart failure with aortic valve stenosis and irregular heart rate: appreciate cardiology input. Outpatient cardiologist is Dr. Juel Burrow. Echocardiogram with aortic stenosis and diastolic dysfunction - furosemide  - free water restriction.    LOS: 4 Claudia Warren 8/2/201610:32 AM

## 2014-09-01 NOTE — Discharge Summary (Signed)
Claudia Warren, 81 y.o., DOB [birth year], MRN [id number]. Admission date: 08/28/2014 Discharge Date 09/01/2014 Primary MD No primary care provider on file. Admitting Physician Alford Highland, MD  Admission Diagnosis  Third degree heart block [I44.2] Acute hyperkalemia [E87.5]  Discharge Diagnosis   Active Problems:   Symptomatic bradycardia    Digitalis toxicity Acute on chronic diastolic CHF   Pulmonary edema   Acute  Respiratory failure Severe  Aortic stenosis   Acute renal failure   Acute hyperkalemia   Dyspnea   Atrial fibrillation  rest restless leg syndrome Mnire's disease        Hospital Course Claudia Warren is a 81 y.o. female with a known history of CHF, hypertension, atrial fibrillation, edema. She presents with not feeling well and some shortness of breath. This morning she felt very dizzy and not feeling well at all. In the emergency room she was found to be bradycardic in junctional rhythm, acute renal failure and hyperkalemia. Hospitalist services were contacted for admission. Patient was also noticed to have digitalis toxicity. She received a dose of Digibind. She was also on metoprolol that was stopped. Her hyperkalemia was treated with normalization of her potassium. With holding her medications and treating her dig toxicity and hyperkalemia patient converted to atrial fibrillation. She was seen by cardiology and they did not feel that she needed a pacemaker. Patient was also noticed to have acute respiratory failure due to acute diastolic CHF. She did have a echo which showed severe aortic stenosis. I have explained to the patient that this will cause her to have chronic shortness of breath unfortunately with her age and multiple other medical issues she is not felt to be a great anticoagulation candidate. Patient also was noted to be anemic but is on chronic anticoagulation her hemoglobin needs to be monitored. She is very weak and is in need of  rehabilitation.          Consults  cardiology, nephrology  Significant Tests:  See full reports for all details    Dg Chest 1 View  08/30/2014   CLINICAL DATA:  Shortness of breath  EXAM: CHEST  1 VIEW  COMPARISON:  08/29/2014  FINDINGS: There is cardiomegaly with vascular congestion. Diffuse interstitial prominence has slightly improved. There are small bilateral pleural effusions with bibasilar atelectasis.  IMPRESSION: Improving interstitial edema pattern.  Mild edema persists.  Small bilateral pleural effusions with bibasilar atelectasis.   Electronically Signed   By: Charlett Nose M.D.   On: 08/30/2014 09:20   Dg Chest 1 View  08/29/2014   CLINICAL DATA:  81 year old female with a history of dyspnea.  EXAM: CHEST  1 VIEW  COMPARISON:  08/28/2014, 03/13/2014  FINDINGS: Cardiomediastinal silhouette unchanged. Atherosclerotic calcifications of the aortic arch.  Defibrillator pads remain on the chest.  Mixed interstitial and airspace disease, with prominence of the central vasculature. Similar interlobular septal thickening. Left base not well evaluated.  No pneumothorax.  IMPRESSION: Persisting evidence of congestive heart failure, with mildly worsened edema and atelectasis. Cannot rule out left basilar pleural effusion.  Unchanged position of defibrillator pads.  Atherosclerosis.  Signed,  Yvone Neu. Loreta Ave, DO  [facility]  [facility]   Electronically Signed   By: Gilmer Mor D.O.   On: 08/29/2014 08:44   Dg Chest Port 1 View  08/28/2014   CLINICAL DATA:  Respiratory failure. Bradycardia, renal failure and congestive heart failure.  EXAM: PORTABLE CHEST - 1 VIEW  COMPARISON:  Film earlier today at  0600 hr.  FINDINGS: Worsening congestive heart failure present with more overt pulmonary edema now present. No significant pleural effusions. Stable moderate cardiomegaly. Pacing pads remain overlying the chest. No pneumothorax.  IMPRESSION: Worsening  CHF.   Electronically Signed   By: Irish Lack M.D.   On: 08/28/2014 10:45   Dg Chest Port 1 View  08/28/2014   CLINICAL DATA:  Awoke with weakness and shortness of breath.  EXAM: PORTABLE CHEST - 1 VIEW  COMPARISON:  Frontal and lateral views 03/13/2014  FINDINGS: Cardiomegaly and atherosclerotic periaortic, unchanged. Mild prominent central vascular congestion. Blunting of right costophrenic angle is unchanged. No consolidation. No pneumothorax. Degenerative changes of both shoulders.  IMPRESSION: Stable cardiomegaly, prominent pulmonary vasculature, and small right pleural effusion. No definite acute process.   Electronically Signed   By: Rubye Oaks M.D.   On: 08/28/2014 06:34       Today   Subjective:   Claudia Warren  feels better, shortness of breath improved  Objective:   Blood pressure 123/53, pulse 68, temperature 97.6 F (36.4 C), temperature source Oral, resp. rate 18, height 5' (1.524 m), weight 64.411 kg (142 lb), SpO2 79 %.  .  Intake/Output Summary (Last 24 hours) at 09/01/14 0901 Last data filed at 09/01/14 0440  Gross per 24 hour  Intake    720 ml  Output   1550 ml  Net   -830 ml    Exam VITAL SIGNS: Blood pressure 123/53, pulse 68, temperature 97.6 F (36.4 C), temperature source Oral, resp. rate 18, height 5' (1.524 m), weight 64.411 kg (142 lb), SpO2 79 %.  GENERAL:  81 y.o.-year-old patient lying in the bed with no acute distress.  EYES: Pupils equal, round, reactive to light and accommodation. No scleral icterus. Extraocular muscles intact.  HEENT: Head atraumatic, normocephalic. Oropharynx and nasopharynx clear.  NECK:  Supple, no jugular venous distention. No thyroid enlargement, no tenderness.  LUNGS: Normal breath sounds bilaterally, no wheezing, rales,rhonchi or crepitation. No use of accessory muscles of respiration.  CARDIOVASCULAR: Irregularly irregular. Systolic murmur present,, rubs, or gallops.  ABDOMEN: Soft, nontender, nondistended.  Bowel sounds present. No organomegaly or mass.  EXTREMITIES: No pedal edema, cyanosis, or clubbing.  NEUROLOGIC: Cranial nerves II through XII are intact. Muscle strength 5/5 in all extremities. Sensation intact. Gait not checked.  PSYCHIATRIC: The patient is alert and oriented x 3.  SKIN: No obvious rash, lesion, or ulcer.   Data Review     CBC w Diff: Lab Results  Component Value Date   WBC 5.0 09/01/2014   WBC 6.0 03/29/2011   HGB 8.3* 09/01/2014   HGB 10.9* 03/29/2011   HCT 27.3* 09/01/2014   HCT 34.2* 03/29/2011   PLT 173 09/01/2014   PLT 186 03/29/2011   LYMPHOPCT 12 09/01/2014   LYMPHOPCT 11.1 03/29/2011   MONOPCT 10 09/01/2014   MONOPCT 7.6 03/29/2011   EOSPCT 3 09/01/2014   EOSPCT 0.6 03/29/2011   BASOPCT 1 09/01/2014   BASOPCT 0.3 03/29/2011   CMP: Lab Results  Component Value Date   NA 137 09/01/2014   NA 140 03/29/2011   K 3.9 09/01/2014   K 4.2 03/29/2011   CL 97* 09/01/2014   CL 101 03/29/2011   CO2 32 09/01/2014   CO2 32 03/29/2011   BUN 32* 09/01/2014   BUN 20* 03/29/2011   CREATININE 1.09* 09/01/2014   CREATININE 0.90 03/29/2011  .  Micro Results Recent Results (from the past 240 hour(s))  MRSA PCR Screening  Status: None   Collection Time: 08/28/14  9:14 AM  Result Value Ref Range Status   MRSA by PCR NEGATIVE NEGATIVE Final    Comment:        The GeneXpert MRSA Assay (FDA approved for NASAL specimens only), is one component of a comprehensive MRSA colonization surveillance program. It is not intended to diagnose MRSA infection nor to guide or monitor treatment for MRSA infections.         Code Status Orders        Start     Ordered   08/28/14 0804  Do not attempt resuscitation (DNR)   Continuous    Question Answer Comment  In the event of cardiac or respiratory ARREST Do not call a "code blue"   In the event of cardiac or respiratory ARREST Do not perform Intubation, CPR, defibrillation or ACLS   In the event of cardiac  or respiratory ARREST Use medication by any route, position, wound care, and other measures to relive pain and suffering. May use oxygen, suction and manual treatment of airway obstruction as needed for comfort.   Comments nurse may pronounce      08/28/14 0804    Advance Directive Documentation        Most Recent Value   Type of Advance Directive  -- [pt states she has a DNR but not provided by facility at this time.]   Pre-existing out of facility DNR order (yellow form or pink MOST form)     "MOST" Form in Place?            Follow-up Information    Follow up with md at snf In 7 days.      Discharge Medications     Medication List    STOP taking these medications        DIGOX 0.125 MG tablet  Generic drug:  digoxin     metoprolol tartrate 25 MG tablet  Commonly known as:  LOPRESSOR      TAKE these medications        ALPRAZolam 0.25 MG tablet  Commonly known as:  XANAX  Take 1 tablet (0.25 mg total) by mouth every morning.     cephALEXin 250 MG capsule  Commonly known as:  KEFLEX  Take 1 capsule (250 mg total) by mouth every 12 (twelve) hours.     docusate sodium 100 MG capsule  Commonly known as:  COLACE  Take 2 capsules (200 mg total) by mouth 2 (two) times daily.     furosemide 40 MG tablet  Commonly known as:  LASIX  Take 1 tablet by mouth daily.     levothyroxine 75 MCG tablet  Commonly known as:  SYNTHROID, LEVOTHROID  Take 1 tablet by mouth daily.     loratadine 10 MG tablet  Commonly known as:  CLARITIN  Take 10 mg by mouth daily.     omeprazole 20 MG capsule  Commonly known as:  PRILOSEC  Take 1 capsule by mouth every morning.     polyethylene glycol packet  Commonly known as:  MIRALAX / GLYCOLAX  Take 17 g by mouth daily as needed for mild constipation.     potassium chloride SA 20 MEQ tablet  Commonly known as:  K-DUR,KLOR-CON  Take 1 tablet by mouth daily.     pramipexole 0.75 MG tablet  Commonly known as:  MIRAPEX  Take 0.75 mg  by mouth every evening.     simvastatin 20 MG tablet  Commonly known as:  ZOCOR  Take 1 tablet by mouth daily.     spironolactone 25 MG tablet  Commonly known as:  ALDACTONE  Take 1 tablet by mouth daily.     warfarin 3 MG tablet  Commonly known as:  COUMADIN  Take 1 tablet by mouth daily.           Total Time in preparing paper work, data evaluation and todays exam - 35 minutes  Auburn Bilberry M.D on 09/01/2014 at 9:01 AM  [facility]   Office  [phone]

## 2014-09-01 NOTE — Discharge Instructions (Signed)
°  DIET:  Cardiac diet  DISCHARGE CONDITION:  Fair  ACTIVITY:  Activity as tolerated  OXYGEN:  Home Oxygen: Yes.     Oxygen Delivery: 2 liters/min via Patient connected to nasal cannula oxygen  DISCHARGE LOCATION:  nursing home    ADDITIONAL DISCHARGE INSTRUCTION: 0xygen at all time Pt eval and treat   If you experience worsening of your admission symptoms, develop shortness of breath, life threatening emergency, suicidal or homicidal thoughts you must seek medical attention immediately by calling 911 or calling your MD immediately  if symptoms less severe.  You Must read complete instructions/literature along with all the possible adverse reactions/side effects for all the Medicines you take and that have been prescribed to you. Take any new Medicines after you have completely understood and accpet all the possible adverse reactions/side effects.   Please note  You were cared for by a hospitalist during your hospital stay. If you have any questions about your discharge medications or the care you received while you were in the hospital after you are discharged, you can call the unit and asked to speak with the hospitalist on call if the hospitalist that took care of you is not available. Once you are discharged, your primary care physician will handle any further medical issues. Please note that NO REFILLS for any discharge medications will be authorized once you are discharged, as it is imperative that you return to your primary care physician (or establish a relationship with a primary care physician if you do not have one) for your aftercare needs so that they can reassess your need for medications and monitor your lab values.

## 2014-09-01 NOTE — Progress Notes (Signed)
Report called to Frann Rider at [facility].

## 2014-09-02 ENCOUNTER — Other Ambulatory Visit: Payer: Self-pay | Admitting: Internal Medicine

## 2014-09-02 DIAGNOSIS — M109 Gout, unspecified: Secondary | ICD-10-CM | POA: Diagnosis not present

## 2014-09-02 DIAGNOSIS — I350 Nonrheumatic aortic (valve) stenosis: Secondary | ICD-10-CM | POA: Diagnosis not present

## 2014-09-02 DIAGNOSIS — I5032 Chronic diastolic (congestive) heart failure: Secondary | ICD-10-CM | POA: Diagnosis not present

## 2014-09-02 DIAGNOSIS — I4891 Unspecified atrial fibrillation: Secondary | ICD-10-CM | POA: Diagnosis not present

## 2014-09-02 DIAGNOSIS — M79672 Pain in left foot: Secondary | ICD-10-CM

## 2014-09-03 DIAGNOSIS — I4891 Unspecified atrial fibrillation: Secondary | ICD-10-CM | POA: Diagnosis not present

## 2014-09-03 DIAGNOSIS — D649 Anemia, unspecified: Secondary | ICD-10-CM | POA: Diagnosis not present

## 2014-09-03 LAB — CBC WITH DIFFERENTIAL/PLATELET
Basophils Absolute: 0.1 10*3/uL (ref 0–0.1)
Basophils Relative: 1 %
Eosinophils Absolute: 0.2 10*3/uL (ref 0–0.7)
Eosinophils Relative: 3 %
HCT: 29.2 % — ABNORMAL LOW (ref 35.0–47.0)
Hemoglobin: 9.2 g/dL — ABNORMAL LOW (ref 12.0–16.0)
Lymphocytes Relative: 13 %
Lymphs Abs: 0.7 10*3/uL — ABNORMAL LOW (ref 1.0–3.6)
MCH: 24.2 pg — ABNORMAL LOW (ref 26.0–34.0)
MCHC: 31.6 g/dL — ABNORMAL LOW (ref 32.0–36.0)
MCV: 76.4 fL — ABNORMAL LOW (ref 80.0–100.0)
Monocytes Absolute: 0.6 10*3/uL (ref 0.2–0.9)
Monocytes Relative: 11 %
Neutro Abs: 4.1 10*3/uL (ref 1.4–6.5)
Neutrophils Relative %: 72 %
Platelets: 214 10*3/uL (ref 150–440)
RBC: 3.83 MIL/uL (ref 3.80–5.20)
RDW: 19.3 % — ABNORMAL HIGH (ref 11.5–14.5)
WBC: 5.6 10*3/uL (ref 3.6–11.0)

## 2014-09-03 LAB — PROTIME-INR
INR: 1.45
Prothrombin Time: 17.8 s — ABNORMAL HIGH (ref 11.4–15.0)

## 2014-09-05 ENCOUNTER — Other Ambulatory Visit
Admission: RE | Admit: 2014-09-05 | Discharge: 2014-09-05 | Disposition: A | Discharge status: Home / Self Care | Payer: Medicare Other | Source: Ambulatory Visit | Attending: Gerontology | Admitting: Gerontology

## 2014-09-05 DIAGNOSIS — I4891 Unspecified atrial fibrillation: Secondary | ICD-10-CM | POA: Insufficient documentation

## 2014-09-05 LAB — PROTIME-INR
INR: 1.21
Prothrombin Time: 15.5 seconds — ABNORMAL HIGH (ref 11.4–15.0)

## 2014-09-15 DIAGNOSIS — G2581 Restless legs syndrome: Secondary | ICD-10-CM | POA: Diagnosis not present

## 2014-09-15 DIAGNOSIS — I4891 Unspecified atrial fibrillation: Secondary | ICD-10-CM | POA: Diagnosis not present

## 2014-09-15 DIAGNOSIS — I509 Heart failure, unspecified: Secondary | ICD-10-CM | POA: Diagnosis not present

## 2014-09-15 DIAGNOSIS — I10 Essential (primary) hypertension: Secondary | ICD-10-CM | POA: Diagnosis not present

## 2014-09-16 DIAGNOSIS — I10 Essential (primary) hypertension: Secondary | ICD-10-CM | POA: Diagnosis not present

## 2014-09-25 ENCOUNTER — Emergency Department: Payer: Medicare Other

## 2014-09-25 ENCOUNTER — Inpatient Hospital Stay
Admission: EM | Admit: 2014-09-25 | Discharge: 2014-09-28 | DRG: 307 | Disposition: A | Discharge status: Home / Self Care | Payer: Medicare Other | Attending: Internal Medicine | Admitting: Internal Medicine

## 2014-09-25 ENCOUNTER — Encounter: Payer: Self-pay | Admitting: *Deleted

## 2014-09-25 DIAGNOSIS — I495 Sick sinus syndrome: Secondary | ICD-10-CM | POA: Diagnosis not present

## 2014-09-25 DIAGNOSIS — I509 Heart failure, unspecified: Secondary | ICD-10-CM | POA: Diagnosis present

## 2014-09-25 DIAGNOSIS — Z823 Family history of stroke: Secondary | ICD-10-CM | POA: Diagnosis not present

## 2014-09-25 DIAGNOSIS — G2581 Restless legs syndrome: Secondary | ICD-10-CM | POA: Diagnosis not present

## 2014-09-25 DIAGNOSIS — H8109 Meniere's disease, unspecified ear: Secondary | ICD-10-CM | POA: Diagnosis not present

## 2014-09-25 DIAGNOSIS — I482 Chronic atrial fibrillation: Secondary | ICD-10-CM | POA: Diagnosis present

## 2014-09-25 DIAGNOSIS — N289 Disorder of kidney and ureter, unspecified: Secondary | ICD-10-CM

## 2014-09-25 DIAGNOSIS — R42 Dizziness and giddiness: Secondary | ICD-10-CM | POA: Diagnosis not present

## 2014-09-25 DIAGNOSIS — Z9889 Other specified postprocedural states: Secondary | ICD-10-CM | POA: Diagnosis not present

## 2014-09-25 DIAGNOSIS — E039 Hypothyroidism, unspecified: Secondary | ICD-10-CM | POA: Diagnosis not present

## 2014-09-25 DIAGNOSIS — Z9049 Acquired absence of other specified parts of digestive tract: Secondary | ICD-10-CM | POA: Diagnosis not present

## 2014-09-25 DIAGNOSIS — I8393 Asymptomatic varicose veins of bilateral lower extremities: Secondary | ICD-10-CM | POA: Diagnosis present

## 2014-09-25 DIAGNOSIS — Z882 Allergy status to sulfonamides status: Secondary | ICD-10-CM | POA: Diagnosis not present

## 2014-09-25 DIAGNOSIS — I10 Essential (primary) hypertension: Secondary | ICD-10-CM | POA: Diagnosis present

## 2014-09-25 DIAGNOSIS — Z90710 Acquired absence of both cervix and uterus: Secondary | ICD-10-CM | POA: Diagnosis not present

## 2014-09-25 DIAGNOSIS — Z880 Allergy status to penicillin: Secondary | ICD-10-CM | POA: Diagnosis not present

## 2014-09-25 DIAGNOSIS — K219 Gastro-esophageal reflux disease without esophagitis: Secondary | ICD-10-CM | POA: Diagnosis not present

## 2014-09-25 DIAGNOSIS — R001 Bradycardia, unspecified: Secondary | ICD-10-CM | POA: Diagnosis present

## 2014-09-25 DIAGNOSIS — Z8249 Family history of ischemic heart disease and other diseases of the circulatory system: Secondary | ICD-10-CM | POA: Diagnosis not present

## 2014-09-25 DIAGNOSIS — F419 Anxiety disorder, unspecified: Secondary | ICD-10-CM | POA: Diagnosis not present

## 2014-09-25 DIAGNOSIS — Z7901 Long term (current) use of anticoagulants: Secondary | ICD-10-CM | POA: Diagnosis not present

## 2014-09-25 DIAGNOSIS — N390 Urinary tract infection, site not specified: Secondary | ICD-10-CM | POA: Diagnosis present

## 2014-09-25 DIAGNOSIS — I350 Nonrheumatic aortic (valve) stenosis: Secondary | ICD-10-CM

## 2014-09-25 DIAGNOSIS — E784 Other hyperlipidemia: Secondary | ICD-10-CM | POA: Diagnosis not present

## 2014-09-25 DIAGNOSIS — Q253 Supravalvular aortic stenosis: Secondary | ICD-10-CM | POA: Diagnosis not present

## 2014-09-25 DIAGNOSIS — Z9849 Cataract extraction status, unspecified eye: Secondary | ICD-10-CM | POA: Diagnosis not present

## 2014-09-25 DIAGNOSIS — B9620 Unspecified Escherichia coli [E. coli] as the cause of diseases classified elsewhere: Secondary | ICD-10-CM | POA: Diagnosis present

## 2014-09-25 DIAGNOSIS — E785 Hyperlipidemia, unspecified: Secondary | ICD-10-CM | POA: Diagnosis not present

## 2014-09-25 DIAGNOSIS — E860 Dehydration: Secondary | ICD-10-CM | POA: Diagnosis not present

## 2014-09-25 DIAGNOSIS — Z79899 Other long term (current) drug therapy: Secondary | ICD-10-CM | POA: Diagnosis not present

## 2014-09-25 DIAGNOSIS — I480 Paroxysmal atrial fibrillation: Secondary | ICD-10-CM | POA: Diagnosis not present

## 2014-09-25 DIAGNOSIS — M6281 Muscle weakness (generalized): Secondary | ICD-10-CM | POA: Diagnosis not present

## 2014-09-25 DIAGNOSIS — R55 Syncope and collapse: Secondary | ICD-10-CM | POA: Diagnosis not present

## 2014-09-25 DIAGNOSIS — I6523 Occlusion and stenosis of bilateral carotid arteries: Secondary | ICD-10-CM | POA: Diagnosis not present

## 2014-09-25 LAB — PROTIME-INR
INR: 1.32
Prothrombin Time: 16.6 seconds — ABNORMAL HIGH (ref 11.4–15.0)

## 2014-09-25 LAB — URINALYSIS COMPLETE WITH MICROSCOPIC (ARMC ONLY)
Bilirubin Urine: NEGATIVE
Glucose, UA: NEGATIVE mg/dL
Hgb urine dipstick: NEGATIVE
KETONES UR: NEGATIVE mg/dL
Leukocytes, UA: NEGATIVE
Nitrite: POSITIVE — AB
PH: 6 (ref 5.0–8.0)
Protein, ur: NEGATIVE mg/dL
SQUAMOUS EPITHELIAL / LPF: NONE SEEN
Specific Gravity, Urine: 1.01 (ref 1.005–1.030)

## 2014-09-25 LAB — COMPREHENSIVE METABOLIC PANEL
ALK PHOS: 42 U/L (ref 38–126)
ALT: 19 U/L (ref 14–54)
AST: 35 U/L (ref 15–41)
Albumin: 3.8 g/dL (ref 3.5–5.0)
Anion gap: 9 (ref 5–15)
BUN: 49 mg/dL — ABNORMAL HIGH (ref 6–20)
CO2: 25 mmol/L (ref 22–32)
CREATININE: 1.46 mg/dL — AB (ref 0.44–1.00)
Calcium: 9.8 mg/dL (ref 8.9–10.3)
Chloride: 100 mmol/L — ABNORMAL LOW (ref 101–111)
GFR calc non Af Amer: 29 mL/min — ABNORMAL LOW (ref >60)
GFR, EST AFRICAN AMERICAN: 34 mL/min — AB (ref >60)
Glucose, Bld: 163 mg/dL — ABNORMAL HIGH (ref 65–99)
Potassium: 5 mmol/L (ref 3.5–5.1)
SODIUM: 134 mmol/L — AB (ref 135–145)
Total Bilirubin: 0.3 mg/dL (ref 0.3–1.2)
Total Protein: 6.6 g/dL (ref 6.5–8.1)

## 2014-09-25 LAB — CBC WITH DIFFERENTIAL/PLATELET
Basophils Absolute: 0 10*3/uL (ref 0–0.1)
Basophils Relative: 1 %
EOS ABS: 0.1 10*3/uL (ref 0–0.7)
EOS PCT: 1 %
HCT: 29.7 % — ABNORMAL LOW (ref 35.0–47.0)
Hemoglobin: 9.3 g/dL — ABNORMAL LOW (ref 12.0–16.0)
LYMPHS ABS: 0.7 10*3/uL — AB (ref 1.0–3.6)
LYMPHS PCT: 10 %
MCH: 24.2 pg — AB (ref 26.0–34.0)
MCHC: 31.4 g/dL — AB (ref 32.0–36.0)
MCV: 76.9 fL — AB (ref 80.0–100.0)
MONOS PCT: 8 %
Monocytes Absolute: 0.6 10*3/uL (ref 0.2–0.9)
Neutro Abs: 5.6 10*3/uL (ref 1.4–6.5)
Neutrophils Relative %: 80 %
Platelets: 166 10*3/uL (ref 150–440)
RBC: 3.86 MIL/uL (ref 3.80–5.20)
RDW: 20 % — ABNORMAL HIGH (ref 11.5–14.5)
WBC: 6.9 10*3/uL (ref 3.6–11.0)

## 2014-09-25 LAB — TROPONIN I
Troponin I: 0.04 ng/mL — ABNORMAL HIGH (ref <0.031)
Troponin I: 0.07 ng/mL — ABNORMAL HIGH (ref <0.031)

## 2014-09-25 MED ORDER — ALPRAZOLAM 0.25 MG PO TABS
0.2500 mg | ORAL_TABLET | ORAL | Status: DC
Start: 2014-09-26 — End: 2014-09-25

## 2014-09-25 MED ORDER — FUROSEMIDE 40 MG PO TABS
40.0000 mg | ORAL_TABLET | Freq: Every day | ORAL | Status: DC
  Administered 2014-09-26 – 2014-09-28 (×3): 40 mg via ORAL
  Filled 2014-09-25 (×3): qty 1

## 2014-09-25 MED ORDER — HEPARIN (PORCINE) IN NACL 100-0.45 UNIT/ML-% IJ SOLN
900.0000 [IU]/h | INTRAMUSCULAR | Status: DC
  Administered 2014-09-25 – 2014-09-26 (×2): 900 [IU]/h via INTRAVENOUS
  Filled 2014-09-25 (×4): qty 250

## 2014-09-25 MED ORDER — LEVOTHYROXINE SODIUM 75 MCG PO TABS
75.0000 ug | ORAL_TABLET | Freq: Every day | ORAL | Status: DC
  Administered 2014-09-26 – 2014-09-28 (×3): 75 ug via ORAL
  Filled 2014-09-25 (×3): qty 1

## 2014-09-25 MED ORDER — GABAPENTIN 100 MG PO CAPS
100.0000 mg | ORAL_CAPSULE | Freq: Two times a day (BID) | ORAL | Status: DC
  Administered 2014-09-25 – 2014-09-28 (×6): 100 mg via ORAL
  Filled 2014-09-25 (×6): qty 1

## 2014-09-25 MED ORDER — PRAMIPEXOLE DIHYDROCHLORIDE 0.25 MG PO TABS
0.7500 mg | ORAL_TABLET | Freq: Every evening | ORAL | Status: DC
  Administered 2014-09-25 – 2014-09-27 (×3): 0.75 mg via ORAL
  Filled 2014-09-25 (×3): qty 3

## 2014-09-25 MED ORDER — POLYETHYLENE GLYCOL 3350 17 G PO PACK: 17.0000 g | PACK | Freq: Every day | ORAL | Status: DC | PRN

## 2014-09-25 MED ORDER — ONDANSETRON HCL 4 MG/2ML IJ SOLN: 4.0000 mg | Freq: Four times a day (QID) | INTRAMUSCULAR | Status: DC | PRN

## 2014-09-25 MED ORDER — MECLIZINE HCL 25 MG PO TABS: 12.5000 mg | ORAL_TABLET | Freq: Two times a day (BID) | ORAL | Status: DC | PRN

## 2014-09-25 MED ORDER — ACETAMINOPHEN 650 MG RE SUPP: 650.0000 mg | Freq: Four times a day (QID) | RECTAL | Status: DC | PRN

## 2014-09-25 MED ORDER — WARFARIN SODIUM 3 MG PO TABS
3.0000 mg | ORAL_TABLET | Freq: Every evening | ORAL | Status: DC
  Administered 2014-09-25 – 2014-09-26 (×2): 3 mg via ORAL
  Filled 2014-09-25 (×2): qty 1

## 2014-09-25 MED ORDER — SODIUM CHLORIDE 0.9 % IV BOLUS (SEPSIS)
500.0000 mL | INTRAVENOUS | Status: AC
  Administered 2014-09-25: 500 mL via INTRAVENOUS

## 2014-09-25 MED ORDER — PANTOPRAZOLE SODIUM 40 MG PO TBEC
40.0000 mg | DELAYED_RELEASE_TABLET | Freq: Every day | ORAL | Status: DC
  Administered 2014-09-26 – 2014-09-28 (×3): 40 mg via ORAL
  Filled 2014-09-25 (×3): qty 1

## 2014-09-25 MED ORDER — SIMVASTATIN 20 MG PO TABS
20.0000 mg | ORAL_TABLET | Freq: Every evening | ORAL | Status: DC
  Administered 2014-09-25 – 2014-09-27 (×3): 20 mg via ORAL
  Filled 2014-09-25 (×3): qty 1

## 2014-09-25 MED ORDER — ONDANSETRON HCL 4 MG PO TABS: 4.0000 mg | ORAL_TABLET | Freq: Four times a day (QID) | ORAL | Status: DC | PRN

## 2014-09-25 MED ORDER — DOCUSATE SODIUM 100 MG PO CAPS
200.0000 mg | ORAL_CAPSULE | Freq: Two times a day (BID) | ORAL | Status: DC
  Administered 2014-09-25 – 2014-09-28 (×6): 200 mg via ORAL
  Filled 2014-09-25 (×7): qty 2

## 2014-09-25 MED ORDER — ALPRAZOLAM 0.25 MG PO TABS
0.2500 mg | ORAL_TABLET | Freq: Every evening | ORAL | Status: DC | PRN
  Administered 2014-09-25 – 2014-09-28 (×2): 0.25 mg via ORAL
  Filled 2014-09-25 (×2): qty 1

## 2014-09-25 MED ORDER — LORATADINE 10 MG PO TABS
10.0000 mg | ORAL_TABLET | Freq: Every day | ORAL | Status: DC
  Administered 2014-09-26 – 2014-09-27 (×2): 10 mg via ORAL
  Filled 2014-09-25 (×3): qty 1

## 2014-09-25 MED ORDER — SODIUM CHLORIDE 0.9 % IJ SOLN
3.0000 mL | Freq: Two times a day (BID) | INTRAMUSCULAR | Status: DC
  Administered 2014-09-25 – 2014-09-28 (×6): 3 mL via INTRAVENOUS

## 2014-09-25 MED ORDER — ACETAMINOPHEN 325 MG PO TABS: 650.0000 mg | ORAL_TABLET | Freq: Four times a day (QID) | ORAL | Status: DC | PRN

## 2014-09-25 NOTE — H&P (Addendum)
MD, Hower aware of low BP at this time. No new orders received.  Pt's skin is warm and dry to touch. Varicose veins to bilateral legs and scattered bilateral age related brown spots to bilateral arms noted. Witnessed by Misty Stanley, Charity fundraiser.

## 2014-09-25 NOTE — H&P (Signed)
[facility] - [place] at [facility]   PATIENT NAME: Claudia Warren    MR#:  [id number]  DATE OF BIRTH:  [birth year]  DATE OF ADMISSION:  09/25/2014  PRIMARY CARE PHYSICIAN: Corky Downs, MD   REQUESTING/REFERRING PHYSICIAN: Dr. Loleta Rose  CHIEF COMPLAINT:   Chief Complaint  Patient presents with  . Loss of Consciousness    HISTORY OF PRESENT ILLNESS:  Claudia Warren  is a 81 y.o. female with a known history of CHF, severe aortic stenosis, hypertension, anxiety, hypothyroidism, atrial fibrillation, who presented to the hospital after 2 syncopal episodes.  She cannot recall these events therefore most of the history obtained from the family at bedside. As per the family patient was outside shopping when she felt a bit lightheaded and had to sit down on her walker. Shortly thereafter she noticed that the patient fell backwards and lost consciousness for about 5 minutes. Patient was breathing had a pulse but was shaking a bit and was incontinent on the first episode. Patient shortly thereafter was awake arousable and oriented. Patient had another episode similar to this when she got home and therefore came to the ER further evaluation. During the second episode patient had some nausea and retching but no vomiting. Patient did not have any urinary incontinence during the second episode of her syncope. As per the family patient has never had seizures before. She did not have any postictal state shortly after both episodes. Patient presented to the emergency room was noted to have a mildly elevated troponin and hospitalist services were contacted further treatment and evaluation.  PAST MEDICAL HISTORY:   Past Medical History  Diagnosis Date  . CHF (congestive heart failure)   . Hypertension   . Anxiety   . Hypothyroidism   . Atrial fibrillation   . Edema   . Restless leg   . Meniere disease     PAST SURGICAL HISTORY:   Past Surgical History  Procedure Laterality  Date  . Abdominal hysterectomy    . Cholecystectomy    . Cataract extraction N/A   . Thyroid surgery N/A     SOCIAL HISTORY:   Social History  Substance Use Topics  . Smoking status: Never Smoker   . Smokeless tobacco: Never Used  . Alcohol Use: No    FAMILY HISTORY:   Family History  Problem Relation Age of Onset  . CVA Mother   . CAD Father   . CAD Sister   . CAD Brother     DRUG ALLERGIES:   Allergies  Allergen Reactions  . Penicillins Rash  . Sulfa Antibiotics Rash    REVIEW OF SYSTEMS:   Review of Systems  Constitutional: Negative for fever and weight loss.  HENT: Negative for congestion, nosebleeds and tinnitus.   Eyes: Negative for blurred vision, double vision and redness.  Respiratory: Negative for cough, hemoptysis and shortness of breath.   Cardiovascular: Negative for chest pain, orthopnea, leg swelling and PND.  Gastrointestinal: Negative for nausea, vomiting, abdominal pain, diarrhea and melena.  Genitourinary: Negative for dysuria, urgency and hematuria.  Musculoskeletal: Negative for joint pain and falls.  Neurological: Positive for loss of consciousness. Negative for dizziness, tingling, sensory change, focal weakness, seizures, weakness and headaches.  Endo/Heme/Allergies: Negative for polydipsia. Does not bruise/bleed easily.  Psychiatric/Behavioral: Negative for depression and memory loss. The patient is not nervous/anxious.     MEDICATIONS AT HOME:   Prior to Admission medications   Medication Sig Start Date End Date Taking? Authorizing Provider  ALPRAZolam (XANAX) 0.25 MG tablet Take 1 tablet (0.25 mg total) by mouth every morning. Patient taking differently: Take 0.25 mg by mouth at bedtime.  09/01/14  Yes Auburn Bilberry, MD  docusate sodium (COLACE) 100 MG capsule Take 2 capsules (200 mg total) by mouth 2 (two) times daily. 09/01/14  Yes Auburn Bilberry, MD  furosemide (LASIX) 40 MG tablet Take 40 mg by mouth daily.    Yes Historical  Provider, MD  gabapentin (NEURONTIN) 100 MG capsule Take 100 mg by mouth 2 (two) times daily.   Yes Historical Provider, MD  levothyroxine (SYNTHROID, LEVOTHROID) 75 MCG tablet Take 75 mcg by mouth daily before breakfast.    Yes Historical Provider, MD  loratadine (CLARITIN) 10 MG tablet Take 10 mg by mouth daily.   Yes Historical Provider, MD  meclizine (ANTIVERT) 12.5 MG tablet Take 12.5 mg by mouth 2 (two) times daily as needed for dizziness.   Yes Historical Provider, MD  omeprazole (PRILOSEC) 20 MG capsule Take 20 mg by mouth daily.    Yes Historical Provider, MD  polyethylene glycol (MIRALAX / GLYCOLAX) packet Take 17 g by mouth daily as needed for mild constipation. 09/01/14  Yes Auburn Bilberry, MD  pramipexole (MIRAPEX) 0.75 MG tablet Take 0.75 mg by mouth every evening.   Yes Historical Provider, MD  simvastatin (ZOCOR) 20 MG tablet Take 20 mg by mouth every evening.    Yes Historical Provider, MD  spironolactone (ALDACTONE) 25 MG tablet Take 1 tablet by mouth daily.   Yes Historical Provider, MD  warfarin (COUMADIN) 3 MG tablet Take 3 mg by mouth every evening.    Yes Historical Provider, MD      VITAL SIGNS:  Blood pressure 102/84, pulse 109, temperature 98.2 F (36.8 C), temperature source Oral, resp. rate 18, height 5\' 3"  (1.6 m), weight 64.411 kg (142 lb), SpO2 98 %.  PHYSICAL EXAMINATION:  Physical Exam  GENERAL:  81 y.o.-year-old patient lying in the bed with no acute distress.  EYES: Pupils equal, round, reactive to light and accommodation. No scleral icterus. Extraocular muscles intact.  HEENT: Head atraumatic, normocephalic. Oropharynx and nasopharynx clear. No oropharyngeal erythema, moist oral mucosa  NECK:  Supple, no jugular venous distention. No thyroid enlargement, no tenderness.  LUNGS: Normal breath sounds bilaterally, no wheezing, rales, rhonchi. No use of accessory muscles of respiration.  CARDIOVASCULAR: S1, S2 RRR. 3/6 systolic ejection murmur at the right  sternal border with radiation to the carotids, NO rubs, gallops, clicks.  ABDOMEN: Soft, nontender, nondistended. Bowel sounds present. No organomegaly or mass.  EXTREMITIES: +1 pitting edema from the knees to ankles bilaterally, no cyanosis, or clubbing. + 2 pedal & radial pulses b/l.   NEUROLOGIC: Cranial nerves II through XII are intact. No focal Motor or sensory deficits appreciated b/l PSYCHIATRIC: The patient is alert and oriented x 3. Good affect.  SKIN: No obvious rash, lesion, or ulcer.   LABORATORY PANEL:   CBC  Recent Labs Lab 09/25/14 1549  WBC 6.9  HGB 9.3*  HCT 29.7*  PLT 166   ------------------------------------------------------------------------------------------------------------------  Chemistries   Recent Labs Lab 09/25/14 1549  NA 134*  K 5.0  CL 100*  CO2 25  GLUCOSE 163*  BUN 49*  CREATININE 1.46*  CALCIUM 9.8  AST 35  ALT 19  ALKPHOS 42  BILITOT 0.3   ------------------------------------------------------------------------------------------------------------------  Cardiac Enzymes  Recent Labs Lab 09/25/14 1549  TROPONINI 0.04*   ------------------------------------------------------------------------------------------------------------------  RADIOLOGY:  Ct Head Wo Contrast  09/25/2014   CLINICAL DATA:  Multiple syncopal episodes and dizziness.  EXAM: CT HEAD WITHOUT CONTRAST  TECHNIQUE: Contiguous axial images were obtained from the base of the skull through the vertex without intravenous contrast.  COMPARISON:  None.  FINDINGS: There is generalized brain atrophy with commensurate dilatation of the ventricles and sulci. Mild chronic small vessel ischemic change noted within the deep periventricular and subcortical white matter regions bilaterally.  There is no mass, hemorrhage, edema, or other evidence of acute parenchymal abnormality. No extra-axial hemorrhage. No osseous abnormality. Visualized upper paranasal sinuses are clear. Mastoid  air cells are clear. Superficial soft tissues are unremarkable.  IMPRESSION: 1. Atrophy and chronic ischemic changes in the white matter. 2. No evidence of acute intracranial abnormality. No intracranial mass, hemorrhage, or edema.   Electronically Signed   By: Bary Richard M.D.   On: 09/25/2014 16:09     IMPRESSION AND PLAN:   81 year old female with past medical history of severe aortic stenosis, history of CHF, atrial fibrillation, recent digitalis toxicity with bradycardia, hypothyroidism, anxiety, GERD who presented to the hospital after a syncopal episode.  #1 syncope-most likely cause of patient's syncope is cardiogenic in nature. I suspect this is probably related to her critical aortic stenosis. -Her CT head on admission is negative for any acute neurologic pathology. There is some suspicion for seizure given her episodes although she had no postictal state and this is unlikely to be the case. If patient has another episode while in the hospital would consider neurology consult. -Observe on telemetry, follow cardiac markers, get a cardiology consult to see if she would benefit from valve replacement even given her advanced age.  #2 severe aortic stenosis-suspected cause of her syncope. -Watch on telemetry for any arrhythmias. Get a cardiology consult.  #3 hypothyroidism-continue Synthroid.  #4 history of CHF-clinically patient is not in congestive heart failure presently. Continue home dose Lasix.  #5 GERD-continue Protonix.  #6 history of chronic afibrillation-patient is currently rate controlled. She was on digoxin, metoprolol but that was stopped during her last hospitalization given her digitalis toxicity and bradycardia. -Continue Coumadin, INR subtherapeutic and therefore we'll start patient on heparin nomogram.  #7 history of restless leg syndrome-continue Mirapex.  #8 hyperlipidemia-continue simvastatin.    All the records are reviewed and case discussed with ED  provider. Management plans discussed with the patient, family and they are in agreement.  CODE STATUS: Full  TOTAL TIME TAKING CARE OF THIS PATIENT: 50 minutes.    Houston Siren M.D on 09/25/2014 at 7:11 PM  Between 7am to 6pm - Pager - [phone]  After 6pm go to [email or web address] - password [email or web address] [facility]  [place] [facility]  Office  [phone]  CC: Primary care physician; Corky Downs, MD

## 2014-09-25 NOTE — ED Provider Notes (Signed)
[facility] Emergency Department Provider Note  ____________________________________________  Time seen: Approximately 3:15 PM  I have reviewed the triage vital signs and the nursing notes.   HISTORY  Chief Complaint Loss of Consciousness    HPI Claudia Warren is a 81 y.o. female with a history of atrial fibrillation on warfarin, CHF, hypertension, but who is in generally good health for her age who presents after 2recent episodes of syncope.  In each case she states that she started to feel very lightheaded and dizzy, became nauseated, and then passed out briefly.  The most recent one occurred this afternoon.  She had been shopping but her niece was with her and had been very strict about making sure that the patient spent minimal time out in the heat and was not doing any excess walking.  The patient had a recent stay in the hospital for severe bradycardia in the setting of hyperkalemia, acute renal failure, and elevated digoxin levels.  Her symptoms occur suddenly (acute onset), have been present for several days, and she denies chest pain, shortness of breath, abdominal pain, vomiting, and diarrhea.   Past Medical History  Diagnosis Date  . CHF (congestive heart failure)   . Hypertension   . Anxiety   . Hypothyroidism   . Atrial fibrillation   . Edema   . Restless leg   . Meniere disease     Patient Active Problem List   Diagnosis Date Noted  . Syncope 09/25/2014  . Acute hyperkalemia   . Dyspnea   . Third degree heart block   . Symptomatic bradycardia 08/28/2014  . Digitalis toxicity 08/28/2014  . Volume overload 08/28/2014  . Pulmonary edema 08/28/2014  . Hyperkalemia 08/28/2014  . Respiratory failure 08/28/2014  . Aortic stenosis 08/28/2014  . Acute renal failure 08/28/2014    Past Surgical History  Procedure Laterality Date  . Abdominal hysterectomy    . Cholecystectomy    . Cataract extraction N/A   . Thyroid surgery N/A      No current outpatient prescriptions on file.  Allergies Penicillins and Sulfa antibiotics  Family History  Problem Relation Age of Onset  . CVA Mother   . CAD Father   . CAD Sister   . CAD Brother     Social History Social History  Substance Use Topics  . Smoking status: Never Smoker   . Smokeless tobacco: Never Used  . Alcohol Use: No    Review of Systems Constitutional: No fever/chills Eyes: No visual changes. ENT: No sore throat. Cardiovascular: Denies chest pain. Respiratory: Denies shortness of breath. Gastrointestinal: No abdominal pain.  Nausea, no vomiting.  No diarrhea.  No constipation. Genitourinary: Negative for dysuria. Musculoskeletal: Negative for back pain. Skin: Negative for rash. Neurological: Negative for headaches, focal weakness or numbness.  10-point ROS otherwise negative.  ____________________________________________   PHYSICAL EXAM:  VITAL SIGNS: ED Triage Vitals  Enc Vitals Group     BP 09/25/14 1534 109/59 mmHg     Pulse Rate 09/25/14 1534 86     Resp --      Temp 09/25/14 1534 98.2 F (36.8 C)     Temp Source 09/25/14 1534 Oral     SpO2 09/25/14 1534 98 %     Weight 09/25/14 1534 142 lb (64.411 kg)     Height 09/25/14 1534  (1.6 m)     Head Cir --      Peak Flow --      Pain Score --  Pain Loc --      Pain Edu? --      Excl. in GC? --     Constitutional: Alert and oriented. Well appearing and in no acute distress.  Appears significantly younger than chronological age.  Alert and oriented 3. Eyes: Conjunctivae are normal. PERRL. EOMI. Head: Atraumatic. Nose: No congestion/rhinnorhea. Mouth/Throat: Mucous membranes are moist.  Oropharynx non-erythematous. Neck: No stridor.   Cardiovascular: Normal rate, regular rhythm. Grossly normal heart sounds.  Good peripheral circulation. Respiratory: Normal respiratory effort.  No retractions. Lungs CTAB. Gastrointestinal: Soft and nontender. No distention. No  abdominal bruits. No CVA tenderness. Musculoskeletal: No lower extremity tenderness nor edema.  No joint effusions. Neurologic:  Normal speech and language. No gross focal neurologic deficits are appreciated.  Skin:  Skin is warm, dry and intact. No rash noted. Psychiatric: Mood and affect are normal. Speech and behavior are normal.  ____________________________________________   LABS (all labs ordered are listed, but only abnormal results are displayed)  Labs Reviewed  COMPREHENSIVE METABOLIC PANEL - Abnormal; Notable for the following:    Sodium 134 (*)    Chloride 100 (*)    Glucose, Bld 163 (*)    BUN 49 (*)    Creatinine, Ser 1.46 (*)    GFR calc non Af Amer 29 (*)    GFR calc Af Amer 34 (*)    All other components within normal limits  TROPONIN I - Abnormal; Notable for the following:    Troponin I 0.04 (*)    All other components within normal limits  CBC WITH DIFFERENTIAL/PLATELET - Abnormal; Notable for the following:    Hemoglobin 9.3 (*)    HCT 29.7 (*)    MCV 76.9 (*)    MCH 24.2 (*)    MCHC 31.4 (*)    RDW 20.0 (*)    Lymphs Abs 0.7 (*)    All other components within normal limits  URINALYSIS COMPLETEWITH MICROSCOPIC ([facility] ONLY) - Abnormal; Notable for the following:    Color, Urine YELLOW (*)    APPearance CLEAR (*)    Nitrite POSITIVE (*)    Bacteria, UA FEW (*)    All other components within normal limits  PROTIME-INR - Abnormal; Notable for the following:    Prothrombin Time 16.6 (*)    All other components within normal limits  TROPONIN I - Abnormal; Notable for the following:    Troponin I 0.07 (*)    All other components within normal limits  HEPARIN LEVEL (UNFRACTIONATED)  TROPONIN I  TROPONIN I  BASIC METABOLIC PANEL  CBC   ____________________________________________  EKG  ED ECG REPORT I, Sivan Cuello, the attending physician, personally viewed and interpreted this ECG.   Date: 09/25/2014  EKG Time: 16:19  Rate: 105  Rhythm: sinus  tachycardia, 1st degree AV block  Axis: Normal  Intervals:first-degree A-V block   ST&T Change: Marked ST depression in leads II, III, aVF, V6.  Approximately 1 mm of ST elevation in aVR.  The morphology is similar but the significant depression is new since last EKG (which was a junctional bradycardia in the 20s).    ____________________________________________  RADIOLOGY   Ct Head Wo Contrast  09/25/2014   CLINICAL DATA:  Multiple syncopal episodes and dizziness.  EXAM: CT HEAD WITHOUT CONTRAST  TECHNIQUE: Contiguous axial images were obtained from the base of the skull through the vertex without intravenous contrast.  COMPARISON:  None.  FINDINGS: There is generalized brain atrophy with commensurate dilatation of the ventricles and  sulci. Mild chronic small vessel ischemic change noted within the deep periventricular and subcortical white matter regions bilaterally.  There is no mass, hemorrhage, edema, or other evidence of acute parenchymal abnormality. No extra-axial hemorrhage. No osseous abnormality. Visualized upper paranasal sinuses are clear. Mastoid air cells are clear. Superficial soft tissues are unremarkable.  IMPRESSION: 1. Atrophy and chronic ischemic changes in the white matter. 2. No evidence of acute intracranial abnormality. No intracranial mass, hemorrhage, or edema.   Electronically Signed   By: Bary Richard M.D.   On: 09/25/2014 16:09    ____________________________________________   PROCEDURES  Procedure(s) performed: None  Critical Care performed: No ____________________________________________   INITIAL IMPRESSION / ASSESSMENT AND PLAN / ED COURSE  Pertinent labs & imaging results that were available during my care of the patient were reviewed by me and considered in my medical decision making (see chart for details).  The patient's troponin of 0.04 may or may not be relevant as she had a recent troponin that was also 0.04.  However, I was concerned about her  multiple syncopal episodes, her history of aortic stenosis, her recent history of third degree heart block with a profound junctional bradycardia requiring hospital admission, and her mild volume depletion which is particular disturbing the setting of aortic stenosis.  Additionally she has the EKG changes that appear consistent with inferior ischemia.  I called Dr. Gwen Pounds and spoke by phone because he saw the patient when she was last in the hospital for the junctional bradycardia.  We discussed the case in detail, and he recommended that I admit her for her syncope which may be a result of an intermittent heart block versus her aortic stenosis.  Since she is subtherapeutic on her warfarin and she is likely having intermittent arrhythmias and has a history of atrial fibrillation he recommended that I start a heparin drip with no bolus.  I discussed this with the patient and her family as well as with the hospitalist who will proceed with admission.  ____________________________________________  FINAL CLINICAL IMPRESSION(S) / ED DIAGNOSES  Final diagnoses:  Syncope, unspecified syncope type  Acute renal insufficiency  Aortic stenosis      NEW MEDICATIONS STARTED DURING THIS VISIT:  Current Discharge Medication List       Loleta Rose, MD 09/25/14 [phone]

## 2014-09-25 NOTE — ED Notes (Signed)
Lab called with Troponin 0.04, notified dr York Cerise

## 2014-09-25 NOTE — ED Notes (Signed)
Pt returned from CT °

## 2014-09-25 NOTE — ED Notes (Signed)
Patient transported to CT 

## 2014-09-25 NOTE — Progress Notes (Signed)
ANTICOAGULATION CONSULT NOTE - Initial Consult  Pharmacy Consult for Heparin  Indication: atrial fibrillation  Allergies  Allergen Reactions  . Penicillins Rash  . Sulfa Antibiotics Rash    Patient Measurements: Height:  (160 cm) Weight: 142 lb (64.411 kg) IBW/kg (Calculated) : 52.4 Heparin Dosing Weight: 64.4 kg  Vital Signs: Temp: 98.2 F (36.8 C) (08/26 1534) Temp Source: Oral (08/26 1534) BP: 97/41 mmHg (08/26 1800) Pulse Rate: 65 (08/26 1800)  Labs:  Recent Labs  09/25/14 1549  HGB 9.3*  HCT 29.7*  PLT 166  LABPROT 16.6*  INR 1.32  CREATININE 1.46*  TROPONINI 0.04*    Estimated Creatinine Clearance: 20.8 mL/min (by C-G formula based on Cr of 1.46).   Medical History: Past Medical History  Diagnosis Date  . CHF (congestive heart failure)   . Hypertension   . Anxiety   . Hypothyroidism   . Atrial fibrillation   . Edema   . Restless leg   . Meniere disease     Medications:  Scheduled:   Infusions:  . heparin      Assessment: Claudia Warren is a 81 year old female on heparin for Afib. Pt is on warfarin  daily at home. MD ordered no bolus.  Goal of Therapy:  Heparin level 0.3-0.7 units/ml Monitor platelets by anticoagulation protocol: Yes   Plan:  Start heparin infusion at 900 units/hr Check anti-Xa level in 8 hours and daily while on heparin Continue to monitor H&H and platelets  Next HL ordered for 8/27 0230.  Will continue to monitor.  Cy Blamer 09/25/2014,6:18 PM

## 2014-09-25 NOTE — ED Notes (Signed)
Pt from [facility] with several episodes of syncope, sent here for eval.

## 2014-09-26 DIAGNOSIS — E039 Hypothyroidism, unspecified: Secondary | ICD-10-CM | POA: Diagnosis not present

## 2014-09-26 DIAGNOSIS — R55 Syncope and collapse: Secondary | ICD-10-CM | POA: Diagnosis not present

## 2014-09-26 DIAGNOSIS — I482 Chronic atrial fibrillation: Secondary | ICD-10-CM | POA: Diagnosis not present

## 2014-09-26 DIAGNOSIS — I350 Nonrheumatic aortic (valve) stenosis: Secondary | ICD-10-CM | POA: Diagnosis not present

## 2014-09-26 LAB — HEPARIN LEVEL (UNFRACTIONATED)
HEPARIN UNFRACTIONATED: 0.37 [IU]/mL (ref 0.30–0.70)
HEPARIN UNFRACTIONATED: 0.51 [IU]/mL (ref 0.30–0.70)
Heparin Unfractionated: 0.52 IU/mL (ref 0.30–0.70)

## 2014-09-26 LAB — TROPONIN I
TROPONIN I: 0.1 ng/mL — AB (ref <0.031)
Troponin I: 0.06 ng/mL — ABNORMAL HIGH (ref <0.031)

## 2014-09-26 LAB — CBC
HEMATOCRIT: 27.3 % — AB (ref 35.0–47.0)
HEMOGLOBIN: 8.6 g/dL — AB (ref 12.0–16.0)
MCH: 24.2 pg — ABNORMAL LOW (ref 26.0–34.0)
MCHC: 31.5 g/dL — AB (ref 32.0–36.0)
MCV: 76.8 fL — ABNORMAL LOW (ref 80.0–100.0)
Platelets: 144 10*3/uL — ABNORMAL LOW (ref 150–440)
RBC: 3.56 MIL/uL — AB (ref 3.80–5.20)
RDW: 19.6 % — ABNORMAL HIGH (ref 11.5–14.5)
WBC: 5 10*3/uL (ref 3.6–11.0)

## 2014-09-26 LAB — BASIC METABOLIC PANEL
Anion gap: 6 (ref 5–15)
BUN: 46 mg/dL — AB (ref 6–20)
CALCIUM: 9.4 mg/dL (ref 8.9–10.3)
CO2: 25 mmol/L (ref 22–32)
CREATININE: 1.33 mg/dL — AB (ref 0.44–1.00)
Chloride: 104 mmol/L (ref 101–111)
GFR calc Af Amer: 38 mL/min — ABNORMAL LOW (ref >60)
GFR, EST NON AFRICAN AMERICAN: 33 mL/min — AB (ref >60)
Glucose, Bld: 104 mg/dL — ABNORMAL HIGH (ref 65–99)
POTASSIUM: 4.4 mmol/L (ref 3.5–5.1)
SODIUM: 135 mmol/L (ref 135–145)

## 2014-09-26 NOTE — Progress Notes (Signed)
Per MD Nemiah Commander, order a urine culture, pt's urine has a strong odor

## 2014-09-26 NOTE — Progress Notes (Signed)
ANTICOAGULATION CONSULT NOTE - Initial Consult  Pharmacy Consult for Heparin  Indication: atrial fibrillation  Allergies  Allergen Reactions  . Penicillins Rash  . Sulfa Antibiotics Rash    Patient Measurements: Height:  (160 cm) Weight: 131 lb 8 oz (59.648 kg) IBW/kg (Calculated) : 52.4 Heparin Dosing Weight: 64.4 kg  Vital Signs: Temp: 98.1 F (36.7 C) (08/27 2003) Temp Source: Oral (08/27 2003) BP: 107/55 mmHg (08/27 2003) Pulse Rate: 113 (08/27 2003)  Labs:  Recent Labs  09/25/14 1549 09/25/14 1917 09/25/14 2327 09/26/14 0239 09/26/14 1116 09/26/14 1956  HGB 9.3*  --   --  8.6*  --   --   HCT 29.7*  --   --  27.3*  --   --   PLT 166  --   --  144*  --   --   LABPROT 16.6*  --   --   --   --   --   INR 1.32  --   --   --   --   --   HEPARINUNFRC  --   --   --  0.37 0.52 0.51  CREATININE 1.46*  --   --  1.33*  --   --   TROPONINI 0.04* 0.07* 0.06* 0.10*  --   --     Estimated Creatinine Clearance: 20.9 mL/min (by C-G formula based on Cr of 1.33).   Medical History: Past Medical History  Diagnosis Date  . CHF (congestive heart failure)   . Hypertension   . Anxiety   . Hypothyroidism   . Atrial fibrillation   . Edema   . Restless leg   . Meniere disease     Medications:  Scheduled:  . docusate sodium  200 mg Oral BID  . furosemide  40 mg Oral Daily  . gabapentin  100 mg Oral BID  . levothyroxine  75 mcg Oral QAC breakfast  . loratadine  10 mg Oral Daily  . pantoprazole  40 mg Oral QAC breakfast  . pramipexole  0.75 mg Oral QPM  . simvastatin  20 mg Oral QPM  . sodium chloride  3 mL Intravenous Q12H  . warfarin  3 mg Oral QPM   Infusions:  . heparin 900 Units/hr (09/26/14 2116)    Assessment: Claudia Warren is a 81 year old female on heparin for Afib. Pt is on warfarin  daily at home. MD ordered no bolus.  Goal of Therapy:  Heparin level 0.3-0.7 units/ml Monitor platelets by anticoagulation protocol: Yes   Plan:  Start heparin infusion  at 900 units/hr Check anti-Xa level in 8 hours and daily while on heparin Continue to monitor H&H and platelets  8/27 02:30 anti-Xa 0.37. Recheck in 8 hours to confirm. 8/27 11:16 anti-Xa 0.52. Recheck in 8 hours on 8/27 at 20:00. 8/27: HL @ 20:00 = 0.51 Will continue this pt on current rate of 900 units/hr and recheck HL on 8/28 with AM labs.   Will continue to monitor.  Marcelle Hepner D 09/26/2014,9:25 PM

## 2014-09-26 NOTE — Progress Notes (Signed)
ANTICOAGULATION CONSULT NOTE - Initial Consult  Pharmacy Consult for Heparin  Indication: atrial fibrillation  Allergies  Allergen Reactions  . Penicillins Rash  . Sulfa Antibiotics Rash    Patient Measurements: Height:  (160 cm) Weight: 142 lb (64.411 kg) IBW/kg (Calculated) : 52.4 Heparin Dosing Weight: 64.4 kg  Vital Signs: Temp: 98.2 F (36.8 C) (08/26 2108) Temp Source: Oral (08/26 2108) BP: 96/44 mmHg (08/26 2134) Pulse Rate: 87 (08/26 2134)  Labs:  Recent Labs  09/25/14 1549 09/25/14 1917 09/25/14 2327 09/26/14 0239  HGB 9.3*  --   --  8.6*  HCT 29.7*  --   --  27.3*  PLT 166  --   --  144*  LABPROT 16.6*  --   --   --   INR 1.32  --   --   --   HEPARINUNFRC  --   --   --  0.37  CREATININE 1.46*  --   --   --   TROPONINI 0.04* 0.07* 0.06*  --     Estimated Creatinine Clearance: 20.8 mL/min (by C-G formula based on Cr of 1.46).   Medical History: Past Medical History  Diagnosis Date  . CHF (congestive heart failure)   . Hypertension   . Anxiety   . Hypothyroidism   . Atrial fibrillation   . Edema   . Restless leg   . Meniere disease     Medications:  Scheduled:  . docusate sodium  200 mg Oral BID  . furosemide  40 mg Oral Daily  . gabapentin  100 mg Oral BID  . levothyroxine  75 mcg Oral QAC breakfast  . loratadine  10 mg Oral Daily  . pantoprazole  40 mg Oral QAC breakfast  . pramipexole  0.75 mg Oral QPM  . simvastatin  20 mg Oral QPM  . sodium chloride  3 mL Intravenous Q12H  . warfarin  3 mg Oral QPM   Infusions:  . heparin 900 Units/hr (09/25/14 1856)    Assessment: Claudia Warren is a 81 year old female on heparin for Afib. Pt is on warfarin  daily at home. MD ordered no bolus.  Goal of Therapy:  Heparin level 0.3-0.7 units/ml Monitor platelets by anticoagulation protocol: Yes   Plan:  Start heparin infusion at 900 units/hr Check anti-Xa level in 8 hours and daily while on heparin Continue to monitor H&H and  platelets  Next HL ordered for 8/27 0230.  8/27 02:30 anti-Xa 0.37. Recheck in 8 hours to confirm.  Will continue to monitor.  [name] 09/26/2014,3:15 AM

## 2014-09-26 NOTE — Consult Note (Signed)
[facility] Cardiology Consultation Note  Patient ID: Claudia Warren, MRN: [id number], DOB/AGE: [id number] 79 y.o. Admit date: 09/25/2014   Date of Consult: 09/26/2014 Primary Physician: Corky Downs, MD Primary Cardiologist: None  Chief Complaint:  Chief Complaint  Patient presents with  . Loss of Consciousness   Reason for Consult: syncope  HPI: 81 y.o. female with known chronic atrial fibrillation nonvalvular in nature with severe aortic valve stenosis and episode of significant syncope and bradycardia with last visit most consistent with hyperkalemia and multiple medications. The patient was discharged to home and was to have a follow-up for monitor to assess heart rate control and other causes for syncope but was not placed. The patient has done well until yesterday when she was ambulating and had a syncopal episode and did not hurt herself. She was out for several minutes and did have some significant concerns of urination. She did recover fairly well and since then has had atrial fibrillation with rapid and also variable rate but no evidence of heart block. There has been no evidence of heart failure or myocardial infarction with an elevated troponin only to 0.1. The patient has not ambulated at this time but will follow closely with telemetry  Past Medical History  Diagnosis Date  . CHF (congestive heart failure)   . Hypertension   . Anxiety   . Hypothyroidism   . Atrial fibrillation   . Edema   . Restless leg   . Meniere disease       Surgical History:  Past Surgical History  Procedure Laterality Date  . Abdominal hysterectomy    . Cholecystectomy    . Cataract extraction N/A   . Thyroid surgery N/A      Home Meds: Prior to Admission medications   Medication Sig Start Date End Date Taking? Authorizing Provider  ALPRAZolam (XANAX) 0.25 MG tablet Take 1 tablet (0.25 mg total) by mouth every morning. Patient taking differently: Take 0.25 mg by mouth at bedtime.   09/01/14  Yes Auburn Bilberry, MD  docusate sodium (COLACE) 100 MG capsule Take 2 capsules (200 mg total) by mouth 2 (two) times daily. 09/01/14  Yes Auburn Bilberry, MD  furosemide (LASIX) 40 MG tablet Take 40 mg by mouth daily.    Yes Historical Provider, MD  gabapentin (NEURONTIN) 100 MG capsule Take 100 mg by mouth 2 (two) times daily.   Yes Historical Provider, MD  levothyroxine (SYNTHROID, LEVOTHROID) 75 MCG tablet Take 75 mcg by mouth daily before breakfast.    Yes Historical Provider, MD  loratadine (CLARITIN) 10 MG tablet Take 10 mg by mouth daily.   Yes Historical Provider, MD  meclizine (ANTIVERT) 12.5 MG tablet Take 12.5 mg by mouth 2 (two) times daily as needed for dizziness.   Yes Historical Provider, MD  omeprazole (PRILOSEC) 20 MG capsule Take 20 mg by mouth daily.    Yes Historical Provider, MD  polyethylene glycol (MIRALAX / GLYCOLAX) packet Take 17 g by mouth daily as needed for mild constipation. 09/01/14  Yes Auburn Bilberry, MD  pramipexole (MIRAPEX) 0.75 MG tablet Take 0.75 mg by mouth every evening.   Yes Historical Provider, MD  simvastatin (ZOCOR) 20 MG tablet Take 20 mg by mouth every evening.    Yes Historical Provider, MD  spironolactone (ALDACTONE) 25 MG tablet Take 1 tablet by mouth daily.   Yes Historical Provider, MD  warfarin (COUMADIN) 3 MG tablet Take 3 mg by mouth every evening.    Yes Historical Provider, MD  Inpatient Medications:  . docusate sodium  200 mg Oral BID  . furosemide  40 mg Oral Daily  . gabapentin  100 mg Oral BID  . levothyroxine  75 mcg Oral QAC breakfast  . loratadine  10 mg Oral Daily  . pantoprazole  40 mg Oral QAC breakfast  . pramipexole  0.75 mg Oral QPM  . simvastatin  20 mg Oral QPM  . sodium chloride  3 mL Intravenous Q12H  . warfarin  3 mg Oral QPM   . heparin 900 Units/hr (09/25/14 1856)    Allergies:  Allergies  Allergen Reactions  . Penicillins Rash  . Sulfa Antibiotics Rash    Social History   Social History  .  Marital Status: Widowed    Spouse Name: N/A  . Number of Children: N/A  . Years of Education: N/A   Occupational History  . Not on file.   Social History Main Topics  . Smoking status: Never Smoker   . Smokeless tobacco: Never Used  . Alcohol Use: No  . Drug Use: No  . Sexual Activity: No   Other Topics Concern  . Not on file   Social History Narrative     Family History  Problem Relation Age of Onset  . CVA Mother   . CAD Father   . CAD Sister   . CAD Brother      Review of Systems Positive for syncope Negative for: General:  chills, fever, night sweats or weight changes.  Cardiovascular: PND orthopnea   Dermatological skin lesions rashes Respiratory: Cough congestion Urologic: Frequent urination urination at night and hematuria Abdominal: negative for nausea, vomiting, diarrhea, bright red blood per rectum, melena, or hematemesis Neurologic: negative for visual changes, and/or hearing changes  All other systems reviewed and are otherwise negative except as noted above.  Labs:  Recent Labs  09/25/14 1549 09/25/14 1917 09/25/14 2327 09/26/14 0239  TROPONINI 0.04* 0.07* 0.06* 0.10*   Lab Results  Component Value Date   WBC 5.0 09/26/2014   HGB 8.6* 09/26/2014   HCT 27.3* 09/26/2014   MCV 76.8* 09/26/2014   PLT 144* 09/26/2014    Recent Labs Lab 09/25/14 1549 09/26/14 0239  NA 134* 135  K 5.0 4.4  CL 100* 104  CO2 25 25  BUN 49* 46*  CREATININE 1.46* 1.33*  CALCIUM 9.8 9.4  PROT 6.6  --   BILITOT 0.3  --   ALKPHOS 42  --   ALT 19  --   AST 35  --   GLUCOSE 163* 104*   No results found for: CHOL, HDL, LDLCALC, TRIG No results found for: DDIMER  Radiology/Studies:  Dg Chest 1 View  08/30/2014   CLINICAL DATA:  Shortness of breath  EXAM: CHEST  1 VIEW  COMPARISON:  08/29/2014  FINDINGS: There is cardiomegaly with vascular congestion. Diffuse interstitial prominence has slightly improved. There are small bilateral pleural effusions with  bibasilar atelectasis.  IMPRESSION: Improving interstitial edema pattern.  Mild edema persists.  Small bilateral pleural effusions with bibasilar atelectasis.   Electronically Signed   By: Charlett Nose M.D.   On: 08/30/2014 09:20   Dg Chest 1 View  08/29/2014   CLINICAL DATA:  81 year old female with a history of dyspnea.  EXAM: CHEST  1 VIEW  COMPARISON:  08/28/2014, 03/13/2014  FINDINGS: Cardiomediastinal silhouette unchanged. Atherosclerotic calcifications of the aortic arch.  Defibrillator pads remain on the chest.  Mixed interstitial and airspace disease, with prominence of the central vasculature. Similar interlobular  septal thickening. Left base not well evaluated.  No pneumothorax.  IMPRESSION: Persisting evidence of congestive heart failure, with mildly worsened edema and atelectasis. Cannot rule out left basilar pleural effusion.  Unchanged position of defibrillator pads.  Atherosclerosis.  Signed,  Yvone Neu. Loreta Ave, DO  [facility]  [facility]   Electronically Signed   By: Gilmer Mor D.O.   On: 08/29/2014 08:44   Ct Head Wo Contrast  09/25/2014   CLINICAL DATA:  Multiple syncopal episodes and dizziness.  EXAM: CT HEAD WITHOUT CONTRAST  TECHNIQUE: Contiguous axial images were obtained from the base of the skull through the vertex without intravenous contrast.  COMPARISON:  None.  FINDINGS: There is generalized brain atrophy with commensurate dilatation of the ventricles and sulci. Mild chronic small vessel ischemic change noted within the deep periventricular and subcortical white matter regions bilaterally.  There is no mass, hemorrhage, edema, or other evidence of acute parenchymal abnormality. No extra-axial hemorrhage. No osseous abnormality. Visualized upper paranasal sinuses are clear. Mastoid air cells are clear. Superficial soft tissues are unremarkable.  IMPRESSION: 1. Atrophy and chronic ischemic changes in the white matter. 2. No evidence  of acute intracranial abnormality. No intracranial mass, hemorrhage, or edema.   Electronically Signed   By: Bary Richard M.D.   On: 09/25/2014 16:09   Dg Chest Port 1 View  08/28/2014   CLINICAL DATA:  Respiratory failure. Bradycardia, renal failure and congestive heart failure.  EXAM: PORTABLE CHEST - 1 VIEW  COMPARISON:  Film earlier today at 0600 hr.  FINDINGS: Worsening congestive heart failure present with more overt pulmonary edema now present. No significant pleural effusions. Stable moderate cardiomegaly. Pacing pads remain overlying the chest. No pneumothorax.  IMPRESSION: Worsening CHF.   Electronically Signed   By: Irish Lack M.D.   On: 08/28/2014 10:45   Dg Chest Port 1 View  08/28/2014   CLINICAL DATA:  Awoke with weakness and shortness of breath.  EXAM: PORTABLE CHEST - 1 VIEW  COMPARISON:  Frontal and lateral views 03/13/2014  FINDINGS: Cardiomegaly and atherosclerotic periaortic, unchanged. Mild prominent central vascular congestion. Blunting of right costophrenic angle is unchanged. No consolidation. No pneumothorax. Degenerative changes of both shoulders.  IMPRESSION: Stable cardiomegaly, prominent pulmonary vasculature, and small right pleural effusion. No definite acute process.   Electronically Signed   By: Rubye Oaks M.D.   On: 08/28/2014 06:34    EKG: Atrial fibrillation with variable rate and nonspecific ST and T-wave changes  Weights: Filed Weights   09/25/14 1534 09/26/14 0441  Weight: 142 lb (64.411 kg) 131 lb 8 oz (59.648 kg)     Physical Exam: Blood pressure 111/53, pulse 79, temperature 98.7 F (37.1 C), temperature source Oral, resp. rate 20, height 5\' 3"  (1.6 m), weight 131 lb 8 oz (59.648 kg), SpO2 96 %. Body mass index is 23.3 kg/(m^2). General: Well developed, well nourished, in no acute distress. Head eyes ears nose throat: Normocephalic, atraumatic, sclera non-icteric, no xanthomas, nares are without discharge. No apparent thyromegaly and/or  mass  Lungs: Normal respiratory effort.  no wheezes, no rales, no rhonchi.  Heart: Irregular with normal S1 soft S2. 4+ aortic murmur no gallop, no rub, PMI is normal size and placement, carotid upstroke normal with bruit, jugular venous pressure is normal Abdomen: Soft, non-tender, non-distended with normoactive bowel sounds. No hepatomegaly. No rebound/guarding. No obvious abdominal masses. Abdominal aorta is normal size without bruit Extremities: Trace to 1+ edema. no cyanosis, no clubbing, no ulcers  Peripheral : 2+ bilateral upper extremity pulses, 2+ bilateral femoral pulses, 2+ bilateral dorsal pedal pulse Neuro: Alert and oriented. No facial asymmetry. No focal deficit. Moves all extremities spontaneously. Musculoskeletal: Normal muscle tone without kyphosis Psych:  Responds to questions appropriately with a normal affect.    Assessment: 81 year old female with severe aortic valve stenosis and nonvalvular chronic atrial fibrillation with variable rate and an episode of syncope most consistent with sick sinus syndrome and aortic valve stenosis without evidence of congestive heart failure or myocardial infarction  Plan: 1. Continue telemetry with ambulation following for any further significant rhythm disturbances causing syncopal episode and further discussion of possible pacemaker placement if necessary 2. No use of medication management for heart rate control of atrial fibrillation due to concerns of syncope and sick sinus syndrome 3. No further diagnostic testing at this time due to previous history and recent echocardiogram showing normal LV systolic function and severe aortic valve stenosis 4. Further diagnostic testing and treatment options after above and possible discharge to home with the 30 day monitor if patient ambulating well by tomorrow  Signed, Lamar Blinks M.D. [facility] Cardiology 09/26/2014, 12:20 PM

## 2014-09-26 NOTE — Progress Notes (Addendum)
ANTICOAGULATION CONSULT NOTE - Initial Consult  Pharmacy Consult for Heparin  Indication: atrial fibrillation  Allergies  Allergen Reactions  . Penicillins Rash  . Sulfa Antibiotics Rash    Patient Measurements: Height:  (160 cm) Weight: 131 lb 8 oz (59.648 kg) IBW/kg (Calculated) : 52.4 Heparin Dosing Weight: 64.4 kg  Vital Signs: Temp: 98.7 F (37.1 C) (08/27 1202) Temp Source: Oral (08/27 1202) BP: 111/53 mmHg (08/27 1202) Pulse Rate: 79 (08/27 1202)  Labs:  Recent Labs  09/25/14 1549 09/25/14 1917 09/25/14 2327 09/26/14 0239 09/26/14 1116  HGB 9.3*  --   --  8.6*  --   HCT 29.7*  --   --  27.3*  --   PLT 166  --   --  144*  --   LABPROT 16.6*  --   --   --   --   INR 1.32  --   --   --   --   HEPARINUNFRC  --   --   --  0.37 0.52  CREATININE 1.46*  --   --  1.33*  --   TROPONINI 0.04* 0.07* 0.06* 0.10*  --     Estimated Creatinine Clearance: 20.9 mL/min (by C-G formula based on Cr of 1.33).   Medical History: Past Medical History  Diagnosis Date  . CHF (congestive heart failure)   . Hypertension   . Anxiety   . Hypothyroidism   . Atrial fibrillation   . Edema   . Restless leg   . Meniere disease     Medications:  Scheduled:  . docusate sodium  200 mg Oral BID  . furosemide  40 mg Oral Daily  . gabapentin  100 mg Oral BID  . levothyroxine  75 mcg Oral QAC breakfast  . loratadine  10 mg Oral Daily  . pantoprazole  40 mg Oral QAC breakfast  . pramipexole  0.75 mg Oral QPM  . simvastatin  20 mg Oral QPM  . sodium chloride  3 mL Intravenous Q12H  . warfarin  3 mg Oral QPM   Infusions:  . heparin 900 Units/hr (09/25/14 1856)    Assessment: Claudia Warren is a 81 year old female on heparin for Afib. Pt is on warfarin  daily at home. MD ordered no bolus.  Goal of Therapy:  Heparin level 0.3-0.7 units/ml Monitor platelets by anticoagulation protocol: Yes   Plan:  Start heparin infusion at 900 units/hr Check anti-Xa level in 8 hours and daily  while on heparin Continue to monitor H&H and platelets  8/27 02:30 anti-Xa 0.37. Recheck in 8 hours to confirm. 8/27 11:16 anti-Xa 0.52. Recheck in 8 hours on 8/27 at 20:00.  Will continue to monitor.  Claudia Warren K 09/26/2014,1:05 PM

## 2014-09-26 NOTE — Progress Notes (Signed)
[facility] [place] at [facility]   PATIENT NAME: Claudia Warren    MR#:  [id number]  DATE OF BIRTH:  [birth year]  SUBJECTIVE:  CHIEF COMPLAINT:   Chief Complaint  Patient presents with  . Loss of Consciousness   Patient admitted for syncope twice. Feels fine now. Continue cardiac monitoring. Patient has history of severe aortic stenosis.  REVIEW OF SYSTEMS:  Review of Systems  Constitutional: Negative for fever and chills.  Respiratory: Negative for cough, shortness of breath and wheezing.   Cardiovascular: Negative for chest pain and palpitations.  Gastrointestinal: Negative for nausea, vomiting, abdominal pain, diarrhea and constipation.  Genitourinary: Negative for dysuria.  Neurological: Negative for dizziness, seizures and headaches.    DRUG ALLERGIES:   Allergies  Allergen Reactions  . Penicillins Rash  . Sulfa Antibiotics Rash    VITALS:  Blood pressure 111/53, pulse 79, temperature 98.7 F (37.1 C), temperature source Oral, resp. rate 20, height 5\' 3"  (1.6 m), weight 59.648 kg (131 lb 8 oz), SpO2 96 %.  PHYSICAL EXAMINATION:  Physical Exam  GENERAL:  81 y.o.-year-old patient lying in the bed with no acute distress.  EYES: Pupils equal, round, reactive to light and accommodation. No scleral icterus. Extraocular muscles intact.  HEENT: Head atraumatic, normocephalic. Oropharynx and nasopharynx clear.  NECK:  Supple, no jugular venous distention. No thyroid enlargement, no tenderness.  LUNGS: Normal breath sounds bilaterally, no wheezing, rales,rhonchi or crepitation. No use of accessory muscles of respiration.  CARDIOVASCULAR: S1, S2 normal. No rubs, or gallops. 3/6 loud systolic murmur is present ABDOMEN: Soft, nontender, nondistended. Bowel sounds present. No organomegaly or mass.  EXTREMITIES: No pedal edema, cyanosis, or clubbing.  NEUROLOGIC: Cranial nerves II through XII are intact. Muscle strength 5/5 in all extremities. Sensation  intact. Gait not checked.  PSYCHIATRIC: The patient is alert and oriented x 3.  SKIN: No obvious rash, lesion, or ulcer.    LABORATORY PANEL:   CBC  Recent Labs Lab 09/26/14 0239  WBC 5.0  HGB 8.6*  HCT 27.3*  PLT 144*   ------------------------------------------------------------------------------------------------------------------  Chemistries   Recent Labs Lab 09/25/14 1549 09/26/14 0239  NA 134* 135  K 5.0 4.4  CL 100* 104  CO2 25 25  GLUCOSE 163* 104*  BUN 49* 46*  CREATININE 1.46* 1.33*  CALCIUM 9.8 9.4  AST 35  --   ALT 19  --   ALKPHOS 42  --   BILITOT 0.3  --    ------------------------------------------------------------------------------------------------------------------  Cardiac Enzymes  Recent Labs Lab 09/26/14 0239  TROPONINI 0.10*   ------------------------------------------------------------------------------------------------------------------  RADIOLOGY:  Ct Head Wo Contrast  09/25/2014   CLINICAL DATA:  Multiple syncopal episodes and dizziness.  EXAM: CT HEAD WITHOUT CONTRAST  TECHNIQUE: Contiguous axial images were obtained from the base of the skull through the vertex without intravenous contrast.  COMPARISON:  None.  FINDINGS: There is generalized brain atrophy with commensurate dilatation of the ventricles and sulci. Mild chronic small vessel ischemic change noted within the deep periventricular and subcortical white matter regions bilaterally.  There is no mass, hemorrhage, edema, or other evidence of acute parenchymal abnormality. No extra-axial hemorrhage. No osseous abnormality. Visualized upper paranasal sinuses are clear. Mastoid air cells are clear. Superficial soft tissues are unremarkable.  IMPRESSION: 1. Atrophy and chronic ischemic changes in the white matter. 2. No evidence of acute intracranial abnormality. No intracranial mass, hemorrhage, or edema.   Electronically Signed   By: Bary Richard M.D.   On: 09/25/2014 16:09  EKG:   Orders placed or performed during the hospital encounter of 09/25/14  . ED EKG  . ED EKG  . EKG 12-Lead  . EKG 12-Lead    ASSESSMENT AND PLAN:   81 year old female with past medical history of severe aortic stenosis, history of CHF, atrial fibrillation, recent digitalis toxicity with bradycardia, hypothyroidism, anxiety, GERD who presented to the hospital after a syncopal episode.  #1 syncope likely cardiogenic syncope. -Has critical aortic stenosis. Appreciate cardiology consult. -Patient not interested in any cardiac surgery at this time for her valve. -Monitor on telemetry to rule out any arrhythmias. -If no arrhythmias noted here, will need to be discharged on a 30 day monitor. If any pauses or blocks notified-Will need a permanent pacemaker. -Ambulate tomorrow for any symptoms, if doing fine, will discharge home. -Her CT head on admission is negative for any acute neurologic pathology.  -Cardiac markers elevated, known history of aortic stenosis which can cause that. Patient anyway on heparin drip.  #2 severe aortic stenosis-suspected cause of her syncope. -Appreciate cardiology consult.  #3 hypothyroidism-continue Synthroid.  #4 history of CHF-clinically patient is not in congestive heart failure presently. Continue home dose Lasix.  #5 GERD-continue Protonix.  #6 history of chronic afibrillation-patient is currently rate controlled. She was on digoxin, metoprolol but that was stopped during her last hospitalization given her digitalis toxicity and bradycardia. -Continue Coumadin, INR subtherapeutic and so on heparin nomogram. Follow-up INR tomorrow a.m.  #7 history of restless leg syndrome-continue Mirapex.  #8 hyperlipidemia-continue simvastatin.   All the records are reviewed and case discussed with Care Management/Social Workerr. Management plans discussed with the patient, family and they are in agreement.  CODE STATUS: Full code  TOTAL TIME TAKING  CARE OF THIS PATIENT: 37 minutes.   POSSIBLE D/C IN 1-2 DAYS, DEPENDING ON CLINICAL CONDITION.   Mardel Grudzien M.D on 09/26/2014 at 5:31 PM  Between 7am to 6pm - Pager - [phone]  After 6pm go to [email or web address] - password [email or web address] [facility]  [facility]  Office  [phone]  CC: Primary care physician; Corky Downs, MD

## 2014-09-27 ENCOUNTER — Observation Stay: Payer: Medicare Other

## 2014-09-27 DIAGNOSIS — Z9849 Cataract extraction status, unspecified eye: Secondary | ICD-10-CM | POA: Diagnosis not present

## 2014-09-27 DIAGNOSIS — K219 Gastro-esophageal reflux disease without esophagitis: Secondary | ICD-10-CM | POA: Diagnosis present

## 2014-09-27 DIAGNOSIS — Z823 Family history of stroke: Secondary | ICD-10-CM | POA: Diagnosis not present

## 2014-09-27 DIAGNOSIS — I495 Sick sinus syndrome: Secondary | ICD-10-CM | POA: Diagnosis present

## 2014-09-27 DIAGNOSIS — R001 Bradycardia, unspecified: Secondary | ICD-10-CM | POA: Diagnosis present

## 2014-09-27 DIAGNOSIS — I509 Heart failure, unspecified: Secondary | ICD-10-CM | POA: Diagnosis present

## 2014-09-27 DIAGNOSIS — I350 Nonrheumatic aortic (valve) stenosis: Secondary | ICD-10-CM | POA: Diagnosis present

## 2014-09-27 DIAGNOSIS — G2581 Restless legs syndrome: Secondary | ICD-10-CM | POA: Diagnosis present

## 2014-09-27 DIAGNOSIS — B9620 Unspecified Escherichia coli [E. coli] as the cause of diseases classified elsewhere: Secondary | ICD-10-CM | POA: Diagnosis present

## 2014-09-27 DIAGNOSIS — N390 Urinary tract infection, site not specified: Secondary | ICD-10-CM | POA: Diagnosis present

## 2014-09-27 DIAGNOSIS — Z880 Allergy status to penicillin: Secondary | ICD-10-CM | POA: Diagnosis not present

## 2014-09-27 DIAGNOSIS — E785 Hyperlipidemia, unspecified: Secondary | ICD-10-CM | POA: Diagnosis present

## 2014-09-27 DIAGNOSIS — Z90710 Acquired absence of both cervix and uterus: Secondary | ICD-10-CM | POA: Diagnosis not present

## 2014-09-27 DIAGNOSIS — Z7901 Long term (current) use of anticoagulants: Secondary | ICD-10-CM | POA: Diagnosis not present

## 2014-09-27 DIAGNOSIS — Z79899 Other long term (current) drug therapy: Secondary | ICD-10-CM | POA: Diagnosis not present

## 2014-09-27 DIAGNOSIS — H8109 Meniere's disease, unspecified ear: Secondary | ICD-10-CM | POA: Diagnosis present

## 2014-09-27 DIAGNOSIS — I6523 Occlusion and stenosis of bilateral carotid arteries: Secondary | ICD-10-CM | POA: Diagnosis not present

## 2014-09-27 DIAGNOSIS — I482 Chronic atrial fibrillation: Secondary | ICD-10-CM | POA: Diagnosis present

## 2014-09-27 DIAGNOSIS — Z8249 Family history of ischemic heart disease and other diseases of the circulatory system: Secondary | ICD-10-CM | POA: Diagnosis not present

## 2014-09-27 DIAGNOSIS — F419 Anxiety disorder, unspecified: Secondary | ICD-10-CM | POA: Diagnosis present

## 2014-09-27 DIAGNOSIS — Z9049 Acquired absence of other specified parts of digestive tract: Secondary | ICD-10-CM | POA: Diagnosis present

## 2014-09-27 DIAGNOSIS — Z882 Allergy status to sulfonamides status: Secondary | ICD-10-CM | POA: Diagnosis not present

## 2014-09-27 DIAGNOSIS — I8393 Asymptomatic varicose veins of bilateral lower extremities: Secondary | ICD-10-CM | POA: Diagnosis present

## 2014-09-27 DIAGNOSIS — E039 Hypothyroidism, unspecified: Secondary | ICD-10-CM | POA: Diagnosis present

## 2014-09-27 DIAGNOSIS — I10 Essential (primary) hypertension: Secondary | ICD-10-CM | POA: Diagnosis present

## 2014-09-27 DIAGNOSIS — E860 Dehydration: Secondary | ICD-10-CM | POA: Diagnosis present

## 2014-09-27 DIAGNOSIS — Z9889 Other specified postprocedural states: Secondary | ICD-10-CM | POA: Diagnosis not present

## 2014-09-27 LAB — BASIC METABOLIC PANEL
ANION GAP: 7 (ref 5–15)
BUN: 33 mg/dL — ABNORMAL HIGH (ref 6–20)
CO2: 26 mmol/L (ref 22–32)
Calcium: 9.7 mg/dL (ref 8.9–10.3)
Chloride: 103 mmol/L (ref 101–111)
Creatinine, Ser: 1.13 mg/dL — ABNORMAL HIGH (ref 0.44–1.00)
GFR calc Af Amer: 46 mL/min — ABNORMAL LOW (ref >60)
GFR calc non Af Amer: 40 mL/min — ABNORMAL LOW (ref >60)
GLUCOSE: 95 mg/dL (ref 65–99)
POTASSIUM: 3.9 mmol/L (ref 3.5–5.1)
Sodium: 136 mmol/L (ref 135–145)

## 2014-09-27 LAB — PROTIME-INR
INR: 1.32
Prothrombin Time: 16.6 seconds — ABNORMAL HIGH (ref 11.4–15.0)

## 2014-09-27 LAB — HEPARIN LEVEL (UNFRACTIONATED): HEPARIN UNFRACTIONATED: 0.47 [IU]/mL (ref 0.30–0.70)

## 2014-09-27 MED ORDER — LEVOFLOXACIN 250 MG PO TABS
250.0000 mg | ORAL_TABLET | Freq: Every day | ORAL | Status: DC
  Administered 2014-09-27 – 2014-09-28 (×2): 250 mg via ORAL
  Filled 2014-09-27 (×2): qty 1

## 2014-09-27 MED ORDER — APIXABAN 2.5 MG PO TABS
2.5000 mg | ORAL_TABLET | Freq: Two times a day (BID) | ORAL | Status: DC
  Administered 2014-09-27 – 2014-09-28 (×3): 2.5 mg via ORAL
  Filled 2014-09-27 (×3): qty 1

## 2014-09-27 NOTE — Progress Notes (Signed)
[facility] Cardiology [facility] Encounter Note  Patient: Claudia Warren / Admit Date: 09/25/2014 / Date of Encounter: 09/27/2014, 11:50 AM   Subjective: Weakness and shortness of breath and no evidence of syncopal episodes  Review of Systems: Positive for: Weakness and shortness of breath Negative for: Vision change, hearing change, syncope, dizziness, nausea, vomiting,diarrhea, bloody stool, stomach pain, cough, congestion, diaphoresis, urinary frequency, urinary pain,skin lesions, skin rashes Others previously listed  Objective: Telemetry: Atrial fibrillation with variable rate Physical Exam: Blood pressure 110/62, pulse 154, temperature 97.8 F (36.6 C), temperature source Oral, resp. rate 17, height  (1.6 m), weight 131 lb 8 oz (59.648 kg), SpO2 97 %. Body mass index is 23.3 kg/(m^2). General: Well developed, well nourished, in no acute distress. Head: Normocephalic, atraumatic, sclera non-icteric, no xanthomas, nares are without discharge. Neck: No apparent masses Lungs: Normal respirations with no wheezes, no rhonchi, no rales , no crackles   Heart: Irregular rate and rhythm, normal S1 soft S2, 4+ aortic murmur, no rub, no gallop, PMI is normal size and placement, carotid upstroke normal with bruit, jugular venous pressure normal Abdomen: Soft, non-tender, non-distended with normoactive bowel sounds. No hepatosplenomegaly. Abdominal aorta is normal size without bruit Extremities: Trace edema, no clubbing, no cyanosis, no ulcers,  Peripheral: 2+ radial, 2+ femoral, 2+ dorsal pedal pulses Neuro: Alert and oriented. Moves all extremities spontaneously. Psych:  Responds to questions appropriately with a normal affect.   Intake/Output Summary (Last 24 hours) at 09/27/14 1150 Last data filed at 09/27/14 1112  Gross per 24 hour  Intake 700.65 ml  Output   1025 ml  Net -324.35 ml    Inpatient Medications:  . docusate sodium  200 mg Oral BID  . furosemide  40 mg Oral Daily   . gabapentin  100 mg Oral BID  . levothyroxine  75 mcg Oral QAC breakfast  . loratadine  10 mg Oral Daily  . pantoprazole  40 mg Oral QAC breakfast  . pramipexole  0.75 mg Oral QPM  . simvastatin  20 mg Oral QPM  . sodium chloride  3 mL Intravenous Q12H   Infusions:    Labs:  Recent Labs  09/26/14 0239 09/27/14 0442  NA 135 136  K 4.4 3.9  CL 104 103  CO2 25 26  GLUCOSE 104* 95  BUN 46* 33*  CREATININE 1.33* 1.13*  CALCIUM 9.4 9.7    Recent Labs  09/25/14 1549  AST 35  ALT 19  ALKPHOS 42  BILITOT 0.3  PROT 6.6  ALBUMIN 3.8    Recent Labs  09/25/14 1549 09/26/14 0239  WBC 6.9 5.0  NEUTROABS 5.6  --   HGB 9.3* 8.6*  HCT 29.7* 27.3*  MCV 76.9* 76.8*  PLT 166 144*    Recent Labs  09/25/14 1549 09/25/14 1917 09/25/14 2327 09/26/14 0239  TROPONINI 0.04* 0.07* 0.06* 0.10*   Invalid input(s): POCBNP No results for input(s): HGBA1C in the last 72 hours.   Weights: Filed Weights   09/25/14 1534 09/26/14 0441  Weight: 142 lb (64.411 kg) 131 lb 8 oz (59.648 kg)     Radiology/Studies:  Dg Chest 1 View  08/30/2014   CLINICAL DATA:  Shortness of breath  EXAM: CHEST  1 VIEW  COMPARISON:  08/29/2014  FINDINGS: There is cardiomegaly with vascular congestion. Diffuse interstitial prominence has slightly improved. There are small bilateral pleural effusions with bibasilar atelectasis.  IMPRESSION: Improving interstitial edema pattern.  Mild edema persists.  Small bilateral pleural effusions with bibasilar atelectasis.  Electronically Signed   By: Charlett Nose M.D.   On: 08/30/2014 09:20   Dg Chest 1 View  08/29/2014   CLINICAL DATA:  81 year old female with a history of dyspnea.  EXAM: CHEST  1 VIEW  COMPARISON:  08/28/2014, 03/13/2014  FINDINGS: Cardiomediastinal silhouette unchanged. Atherosclerotic calcifications of the aortic arch.  Defibrillator pads remain on the chest.  Mixed interstitial and airspace disease, with prominence of the central vasculature.  Similar interlobular septal thickening. Left base not well evaluated.  No pneumothorax.  IMPRESSION: Persisting evidence of congestive heart failure, with mildly worsened edema and atelectasis. Cannot rule out left basilar pleural effusion.  Unchanged position of defibrillator pads.  Atherosclerosis.  Signed,  Yvone Neu. Loreta Ave, DO  [facility]  [facility]   Electronically Signed   By: Gilmer Mor D.O.   On: 08/29/2014 08:44   Ct Head Wo Contrast  09/25/2014   CLINICAL DATA:  Multiple syncopal episodes and dizziness.  EXAM: CT HEAD WITHOUT CONTRAST  TECHNIQUE: Contiguous axial images were obtained from the base of the skull through the vertex without intravenous contrast.  COMPARISON:  None.  FINDINGS: There is generalized brain atrophy with commensurate dilatation of the ventricles and sulci. Mild chronic small vessel ischemic change noted within the deep periventricular and subcortical white matter regions bilaterally.  There is no mass, hemorrhage, edema, or other evidence of acute parenchymal abnormality. No extra-axial hemorrhage. No osseous abnormality. Visualized upper paranasal sinuses are clear. Mastoid air cells are clear. Superficial soft tissues are unremarkable.  IMPRESSION: 1. Atrophy and chronic ischemic changes in the white matter. 2. No evidence of acute intracranial abnormality. No intracranial mass, hemorrhage, or edema.   Electronically Signed   By: Bary Richard M.D.   On: 09/25/2014 16:09     Assessment and Recommendation  81 y.o. female with known severe aortic valve stenosis and chronic nonvalvular atrial fibrillation with acute episode of syncope of unknown etiology either possibly from aortic stenosis with rapid ventricular rate of atrial fibrillation and/or sick sinus syndrome. We have discussed at length both treatments of both issues and the patient wishes not to pursue intervention at this time. Therefore if she may benefit from a  single-wire pacemaker due to sick sinus syndrome with reinstatement of beta blocker for rapid ventricular rate she may be able to have this performed. We are watching telemetry to find whether the patient has this particular diagnosis and would benefit from this treatment 1. Avoid beta blocker or heart rate control at this time due to previous history of sick sinus syndrome 2. Ambulate and follow for further significant symptoms and possible single-wire pacemaker placement if patient continues to have sick sinus syndrome type symptoms and/or heart block for which she would benefit from single-wire pacer 3. Anticoagulation with heparin for further risk reduction in stroke with atrial fibrillation 4. Further evaluation of aortic valve stenosis if necessary  Signed, Arnoldo Hooker M.D. FACC

## 2014-09-27 NOTE — Progress Notes (Signed)
Pt ambulated around the nurses' station x4, heart elevated as high as 143, but mostly remained in the 130s, no pauses and no bradycardia

## 2014-09-27 NOTE — Progress Notes (Signed)
[facility]   PATIENT NAME: Claudia Warren    MR#:  [id number]  DATE OF BIRTH:  October 20, 1917  SUBJECTIVE:  CHIEF COMPLAINT:   Chief Complaint  Patient presents with  . Loss of Consciousness   Patient admitted for syncope twice. Feels fine now.  Likely has sick sinus syndrome, HR fluctuates between 150-60 within seconds. Small pauses noted, no heart block seen  REVIEW OF SYSTEMS:  Review of Systems  Constitutional: Negative for fever and chills.  Respiratory: Negative for cough, shortness of breath and wheezing.   Cardiovascular: Negative for chest pain and palpitations.  Gastrointestinal: Negative for nausea, vomiting, abdominal pain, diarrhea and constipation.  Genitourinary: Negative for dysuria.  Neurological: Negative for dizziness, seizures and headaches.    DRUG ALLERGIES:   Allergies  Allergen Reactions  . Penicillins Rash  . Sulfa Antibiotics Rash    VITALS:  Blood pressure 110/62, pulse 154, temperature 97.8 F (36.6 C), temperature source Oral, resp. rate 17, height 5\' 3"  (1.6 m), weight 59.648 kg (131 lb 8 oz), SpO2 97 %.  PHYSICAL EXAMINATION:  Physical Exam  GENERAL:  81 y.o.-year-old patient lying in the bed with no acute distress.  EYES: Pupils equal, round, reactive to light and accommodation. No scleral icterus. Extraocular muscles intact.  HEENT: Head atraumatic, normocephalic. Oropharynx and nasopharynx clear.  NECK:  Supple, no jugular venous distention. No thyroid enlargement, no tenderness.  LUNGS: Normal breath sounds bilaterally, no wheezing, rales,rhonchi or crepitation. No use of accessory muscles of respiration.  CARDIOVASCULAR: S1, S2 normal. No rubs, or gallops. 3/6 loud systolic murmur is present ABDOMEN: Soft, nontender, nondistended. Bowel sounds present. No organomegaly or mass.  EXTREMITIES: No pedal edema, cyanosis, or clubbing.  NEUROLOGIC: Cranial nerves II through XII are intact.  Muscle strength 5/5 in all extremities. Sensation intact. Gait not checked.  PSYCHIATRIC: The patient is alert and oriented x 3.  SKIN: No obvious rash, lesion, or ulcer.    LABORATORY PANEL:   CBC  Recent Labs Lab 09/26/14 0239  WBC 5.0  HGB 8.6*  HCT 27.3*  PLT 144*   ------------------------------------------------------------------------------------------------------------------  Chemistries   Recent Labs Lab 09/25/14 1549  09/27/14 0442  NA 134*  < > 136  K 5.0  < > 3.9  CL 100*  < > 103  CO2 25  < > 26  GLUCOSE 163*  < > 95  BUN 49*  < > 33*  CREATININE 1.46*  < > 1.13*  CALCIUM 9.8  < > 9.7  AST 35  --   --   ALT 19  --   --   ALKPHOS 42  --   --   BILITOT 0.3  --   --   < > = values in this interval not displayed. ------------------------------------------------------------------------------------------------------------------  Cardiac Enzymes  Recent Labs Lab 09/26/14 0239  TROPONINI 0.10*   ------------------------------------------------------------------------------------------------------------------  RADIOLOGY:  Ct Head Wo Contrast  09/25/2014   CLINICAL DATA:  Multiple syncopal episodes and dizziness.  EXAM: CT HEAD WITHOUT CONTRAST  TECHNIQUE: Contiguous axial images were obtained from the base of the skull through the vertex without intravenous contrast.  COMPARISON:  None.  FINDINGS: There is generalized brain atrophy with commensurate dilatation of the ventricles and sulci. Mild chronic small vessel ischemic change noted within the deep periventricular and subcortical white matter regions bilaterally.  There is no mass, hemorrhage, edema, or other evidence of acute parenchymal abnormality. No extra-axial hemorrhage. No osseous abnormality. Visualized upper paranasal sinuses  are clear. Mastoid air cells are clear. Superficial soft tissues are unremarkable.  IMPRESSION: 1. Atrophy and chronic ischemic changes in the white matter. 2. No evidence of  acute intracranial abnormality. No intracranial mass, hemorrhage, or edema.   Electronically Signed   By: Bary Richard M.D.   On: 09/25/2014 16:09    EKG:   Orders placed or performed during the hospital encounter of 09/25/14  . ED EKG  . ED EKG  . EKG 12-Lead  . EKG 12-Lead    ASSESSMENT AND PLAN:   81 year old female with past medical history of severe aortic stenosis, history of CHF, atrial fibrillation, recent digitalis toxicity with bradycardia, hypothyroidism, anxiety, GERD who presented to the hospital after a syncopal episode.  #1 syncope likely cardiogenic syncope. -Has critical aortic stenosis. Appreciate cardiology consult. -Patient not interested in any cardiac surgery at this time for her valve. -Has sick sinus syndrome- but symptomatic to get a paceer -Since HR not controlled- cont monitoring- will need a 30 day monitor as outpatient.. -carotid US ordered -Her CT head on admission is negative for any acute neurologic pathology.  -Cardiac markers elevated, known history of aortic stenosis which can cause that. Patient anyway anticoagulated.  #2 severe aortic stenosis-suspected cause of her syncope vs the sick sinus syndrome - Patient does not want valve replacement surgery -Appreciate cardiology consult.  #3 history of chronic afibrillation - Likely has sick sinus syndrome HR fluctuates- 15's to 60's  Not on rate limiting drugs as they caused significant bradycardia Plan to ambulate and check heart rate- if elevated or dropped and symptomatic- could get a single wire pacemaker. INR was hard to get therapeutic on coumadin. Discontinue coumadin and heparin drips and start on eliquis.  #4 hypothyroidism-continue Synthroid.  #5 history of CHF-clinically patient is not in congestive heart failure presently. Continue home dose Lasix.  #6 GERD-continue Protonix.  #7 history of restless leg syndrome-continue Mirapex.  #8 hyperlipidemia-continue simvastatin.   All  the records are reviewed and case discussed with Care Management/Social Workerr. Management plans discussed with the patient, family and they are in agreement.  CODE STATUS: Full code  TOTAL TIME TAKING CARE OF THIS PATIENT: 37 minutes.  DISCUSSED WITH DR. Gwen Pounds  POSSIBLE D/C IN 1-2 DAYS, DEPENDING ON CLINICAL CONDITION.   Zakeya Junker M.D on 09/27/2014 at 12:35 PM  Between 7am to 6pm - Pager - [phone]  After 6pm go to [email or web address] - password [email or web address] [facility]  [facility]  Office  [phone]  CC: Primary care physician; Corky Downs, MD

## 2014-09-27 NOTE — Progress Notes (Signed)
ANTICOAGULATION CONSULT NOTE - Initial Consult  Pharmacy Consult for Heparin  Indication: atrial fibrillation  Allergies  Allergen Reactions  . Penicillins Rash  . Sulfa Antibiotics Rash    Patient Measurements: Height:  (160 cm) Weight: 131 lb 8 oz (59.648 kg) IBW/kg (Calculated) : 52.4 Heparin Dosing Weight: 64.4 kg  Vital Signs: Temp: 97.8 F (36.6 C) (08/28 0447) Temp Source: Oral (08/28 0447) BP: 110/62 mmHg (08/28 0447) Pulse Rate: 25 (08/28 0447)  Labs:  Recent Labs  09/25/14 1549 09/25/14 1917 09/25/14 2327  09/26/14 0239 09/26/14 1116 09/26/14 1956 09/27/14 0442  HGB 9.3*  --   --   --  8.6*  --   --   --   HCT 29.7*  --   --   --  27.3*  --   --   --   PLT 166  --   --   --  144*  --   --   --   LABPROT 16.6*  --   --   --   --   --   --  16.6*  INR 1.32  --   --   --   --   --   --  1.32  HEPARINUNFRC  --   --   --   < > 0.37 0.52 0.51 0.47  CREATININE 1.46*  --   --   --  1.33*  --   --  1.13*  TROPONINI 0.04* 0.07* 0.06*  --  0.10*  --   --   --   < > = values in this interval not displayed.  Estimated Creatinine Clearance: 24.6 mL/min (by C-G formula based on Cr of 1.13).   Medical History: Past Medical History  Diagnosis Date  . CHF (congestive heart failure)   . Hypertension   . Anxiety   . Hypothyroidism   . Atrial fibrillation   . Edema   . Restless leg   . Meniere disease     Medications:  Scheduled:  . docusate sodium  200 mg Oral BID  . furosemide  40 mg Oral Daily  . gabapentin  100 mg Oral BID  . levothyroxine  75 mcg Oral QAC breakfast  . loratadine  10 mg Oral Daily  . pantoprazole  40 mg Oral QAC breakfast  . pramipexole  0.75 mg Oral QPM  . simvastatin  20 mg Oral QPM  . sodium chloride  3 mL Intravenous Q12H  . warfarin  3 mg Oral QPM   Infusions:  . heparin 900 Units/hr (09/26/14 2116)    Assessment: Claudia Warren is a 81 year old female on heparin for Afib. Pt is on warfarin  daily at home. MD ordered no  bolus.  Goal of Therapy:  Heparin level 0.3-0.7 units/ml Monitor platelets by anticoagulation protocol: Yes   Plan:  Start heparin infusion at 900 units/hr Check anti-Xa level in 8 hours and daily while on heparin Continue to monitor H&H and platelets  8/27 02:30 anti-Xa 0.37. Recheck in 8 hours to confirm. 8/27 11:16 anti-Xa 0.52. Recheck in 8 hours on 8/27 at 20:00. 8/27: HL @ 20:00 = 0.51 8/28 AM anti-Xa 0.47. Recheck with AM labs.  Will continue this pt on current rate of 900 units/hr and recheck HL on 8/29 with AM labs.   Patient on warfarin 3 mg daily, INR 1.32.   Will continue to monitor.  Jeda Pardue S 09/27/2014,6:58 AM

## 2014-09-27 NOTE — Progress Notes (Signed)
Pt ambulated around nurses' station x 2,heart rate elevated and remained in 130s while ambulating

## 2014-09-27 NOTE — Progress Notes (Signed)
Pt ambulated to nurses's station counter outside her room, heart rate elevated to 154

## 2014-09-27 NOTE — Progress Notes (Signed)
Pt. Ambulated around nurses station 3 times with no SOB, no pauses noted, no dizziness, no acute distress. Will continue to monitor patient.

## 2014-09-28 DIAGNOSIS — N390 Urinary tract infection, site not specified: Secondary | ICD-10-CM | POA: Diagnosis present

## 2014-09-28 DIAGNOSIS — I495 Sick sinus syndrome: Secondary | ICD-10-CM

## 2014-09-28 LAB — CBC
HEMATOCRIT: 32.3 % — AB (ref 35.0–47.0)
HEMOGLOBIN: 10.2 g/dL — AB (ref 12.0–16.0)
MCH: 24.2 pg — AB (ref 26.0–34.0)
MCHC: 31.6 g/dL — ABNORMAL LOW (ref 32.0–36.0)
MCV: 76.7 fL — ABNORMAL LOW (ref 80.0–100.0)
Platelets: 163 10*3/uL (ref 150–440)
RBC: 4.21 MIL/uL (ref 3.80–5.20)
RDW: 20 % — ABNORMAL HIGH (ref 11.5–14.5)
WBC: 5.6 10*3/uL (ref 3.6–11.0)

## 2014-09-28 LAB — URINE CULTURE

## 2014-09-28 MED ORDER — METOPROLOL TARTRATE 25 MG PO TABS
12.5000 mg | ORAL_TABLET | Freq: Two times a day (BID) | ORAL | Status: DC
  Administered 2014-09-28: 12.5 mg via ORAL
  Filled 2014-09-28: qty 1

## 2014-09-28 MED ORDER — METOPROLOL TARTRATE 25 MG PO TABS: 12.5000 mg | ORAL_TABLET | Freq: Two times a day (BID) | ORAL | Status: DC

## 2014-09-28 MED ORDER — APIXABAN 2.5 MG PO TABS
2.5000 mg | ORAL_TABLET | Freq: Two times a day (BID) | ORAL | Status: AC
Start: 2014-09-28 — End: ?

## 2014-09-28 MED ORDER — LEVOFLOXACIN 250 MG PO TABS: 250.0000 mg | ORAL_TABLET | Freq: Every day | ORAL | Status: DC

## 2014-09-28 NOTE — Care Management (Signed)
Patient presents from independent living at [facility] with syncope.  Patient had a previous admission/discharge to skilled nursing on 8/2.  At that time, she was dig toxic.  She is being discharged with an event monitor.  She is also discharging on Eliquis which is a new med.  Provided 30 day coupon and informed that with patient insurance it is usually a tier 3 drug and copay is usually around 45 dollars.  Patient's family members are adamant that patient will not need any type of home health follow up.

## 2014-09-28 NOTE — Progress Notes (Addendum)
Pt. Discharged back to [facility] [place] via [facility]. Discharge instructions and medication regimen reviewed at bedside with patient and HCPOAs (her nieces). Patient and family verbalize understanding of instructions and medication regimen. They were provided with patients medication prescriptions as well as the prescription for the holter monitor and phone number for [employer]. Family verbalizes understanding on where to go to pick up the heart monitor. Patient assessment unchanged from this morning. TELE and IV discontinued per policy.   Patients family voiced their frustration with Ms. Hedglin care while here in the hospital. They reported the patient had not received a bath or had a linen change since her admission and that they had to ask for the commodes to be emptied. The family also reports that the patient wears O2 at night and that no one put oxygen on her at night; the report that pills were found on the floor 8/28 as well. This RN acknowledged this poor care and apologized for it and provided them with directors contact information should they wish to speak with her. RN clarified that this is not the kind of care that is typically provided on this floor and that better care is typically given and should have been provided for Ms. Pedley. The patient was given a bath before discharge. This RN spen approximately 20 minutes going over discharge instructions with the family to ensure that everything was clear for them. Before leaving the floor, this RN again apologized for their experience here and asked further if there was anything else they needed or wanted before they left.

## 2014-09-28 NOTE — Progress Notes (Signed)
Patient ambulated 5x (approx. 1,062ft) around nurses station with walker, gait belt and RN accompanying. HR stayed in 90s-130s. No complaints of dizziness, lightheadedness or chest pain. MD notified and aware.

## 2014-09-28 NOTE — Discharge Summary (Signed)
[facility] - [place] at [facility]   PATIENT NAME: Claudia Warren    MR#:  [id number]  DATE OF BIRTH:  12/23/1917  DATE OF ADMISSION:  09/25/2014 ADMITTING PHYSICIAN: Houston Siren, MD  DATE OF DISCHARGE: 09/28/2014  PRIMARY CARE PHYSICIAN: MASOUD,JAVED, MD    ADMISSION DIAGNOSIS:  Aortic stenosis [I35.0] Acute renal insufficiency [N28.9] Syncope, unspecified syncope type [R55]  DISCHARGE DIAGNOSIS:  Principal Problem:   Sick sinus syndrome Active Problems:   Symptomatic bradycardia   Syncope   UTI (urinary tract infection)   SECONDARY DIAGNOSIS:   Past Medical History  Diagnosis Date  . CHF (congestive heart failure)   . Hypertension   . Anxiety   . Hypothyroidism   . Atrial fibrillation   . Edema   . Restless leg   . Meniere disease     HOSPITAL COURSE:   81 year old female with past medical history of severe aortic stenosis, history of CHF, atrial fibrillation, recent digitalis toxicity with bradycardia, hypothyroidism, anxiety, GERD who presented to the hospital after a syncopal episode.  #1 syncope likely cardiogenic syncope or dehydration and from her UTI -Has critical aortic stenosis. Appreciate cardiology consult. -Patient not interested in any cardiac surgery at this time for her valve. -Has sick sinus syndrome- but not symptomatic to get a paceer -carotid US ordered with some stenosis <49% bilaterally. - Has UTI too -Her CT head on admission is negative for any acute neurologic pathology.  -Cardiac markers elevated, known history of aortic stenosis which can cause that. Patient anyway anticoagulated. - No further syncope here, ambulated fine.  #2 severe aortic stenosis-suspected cause of her syncope vs the sick sinus syndrome - Patient does not want valve replacement surgery -Appreciate cardiology consult.  #3 history of chronic afibrillation - Likely has sick sinus syndrome - low dose metoprolol started as regular dose-  she had bradycardia last admission. No pauses or bradycardia noted, HR goes as high as 130- asymptomatic - 30 day cardiac monitoring will be started after discharge to see if she would be symptomatic and would need a pacemaker. - ambulated fine several times without any trouble - on eliquis- for anticoagulation  #4 hypothyroidism-continue Synthroid.  #5 history of CHF-clinically patient is not in congestive heart failure presently. Continue home dose Lasix.  #6 UTI- E.coli UTI, on Levaquin, finish off the course  #7 history of restless leg syndrome-continue Mirapex.  #8 hyperlipidemia-continue simvastatin.  Doing well, discharge today. 30 day monitor and cardiology follow up.  DISCHARGE CONDITIONS:   Stable  CONSULTS OBTAINED:  Treatment Team:  Lamar Blinks, MD  DRUG ALLERGIES:   Allergies  Allergen Reactions  . Penicillins Rash  . Sulfa Antibiotics Rash    DISCHARGE MEDICATIONS:   Current Discharge Medication List    START taking these medications   Details  apixaban (ELIQUIS) 2.5 MG TABS tablet Take 1 tablet (2.5 mg total) by mouth 2 (two) times daily. Qty: 60 tablet, Refills: 2    levofloxacin (LEVAQUIN) 250 MG tablet Take 1 tablet (250 mg total) by mouth daily. Qty: 3 tablet, Refills: 0    metoprolol tartrate (LOPRESSOR) 25 MG tablet Take 0.5 tablets (12.5 mg total) by mouth 2 (two) times daily. Qty: 60 tablet, Refills: 0      CONTINUE these medications which have NOT CHANGED   Details  ALPRAZolam (XANAX) 0.25 MG tablet Take 1 tablet (0.25 mg total) by mouth every morning. Qty: 30 tablet, Refills: 0    docusate sodium (COLACE) 100 MG capsule  Take 2 capsules (200 mg total) by mouth 2 (two) times daily. Qty: 10 capsule, Refills: 0    furosemide (LASIX) 40 MG tablet Take 40 mg by mouth daily.     gabapentin (NEURONTIN) 100 MG capsule Take 100 mg by mouth 2 (two) times daily.    levothyroxine (SYNTHROID, LEVOTHROID) 75 MCG tablet Take 75 mcg by mouth  daily before breakfast.     loratadine (CLARITIN) 10 MG tablet Take 10 mg by mouth daily.    meclizine (ANTIVERT) 12.5 MG tablet Take 12.5 mg by mouth 2 (two) times daily as needed for dizziness.    omeprazole (PRILOSEC) 20 MG capsule Take 20 mg by mouth daily.     polyethylene glycol (MIRALAX / GLYCOLAX) packet Take 17 g by mouth daily as needed for mild constipation. Qty: 14 each, Refills: 0    pramipexole (MIRAPEX) 0.75 MG tablet Take 0.75 mg by mouth every evening.    simvastatin (ZOCOR) 20 MG tablet Take 20 mg by mouth every evening.     spironolactone (ALDACTONE) 25 MG tablet Take 1 tablet by mouth daily.      STOP taking these medications     warfarin (COUMADIN) 3 MG tablet          DISCHARGE INSTRUCTIONS:   1. Patient needs to pick up her 30 day cardiac monitor from [facility] today 2. F/u with Dr. Gwen Pounds in 1 week 3. PCP f/u in 2 weeks  If you experience worsening of your admission symptoms, develop shortness of breath, life threatening emergency, suicidal or homicidal thoughts you must seek medical attention immediately by calling 911 or calling your MD immediately  if symptoms less severe.  You Must read complete instructions/literature along with all the possible adverse reactions/side effects for all the Medicines you take and that have been prescribed to you. Take any new Medicines after you have completely understood and accept all the possible adverse reactions/side effects.   Please note  You were cared for by a hospitalist during your hospital stay. If you have any questions about your discharge medications or the care you received while you were in the hospital after you are discharged, you can call the unit and asked to speak with the hospitalist on call if the hospitalist that took care of you is not available. Once you are discharged, your primary care physician will handle any further medical issues. Please note that NO REFILLS for any discharge medications  will be authorized once you are discharged, as it is imperative that you return to your primary care physician (or establish a relationship with a primary care physician if you do not have one) for your aftercare needs so that they can reassess your need for medications and monitor your lab values.    Today   CHIEF COMPLAINT:   Chief Complaint  Patient presents with  . Loss of Consciousness    VITAL SIGNS:  Blood pressure 105/78, pulse 135, temperature 97.7 F (36.5 C), temperature source Oral, resp. rate 25, height 5\' 3"  (1.6 m), weight 59.648 kg (131 lb 8 oz), SpO2 96 %.  I/O:   Intake/Output Summary (Last 24 hours) at 09/28/14 1044 Last data filed at 09/28/14 0900  Gross per 24 hour  Intake    960 ml  Output   1750 ml  Net   -790 ml    PHYSICAL EXAMINATION:   Physical Exam  GENERAL: 81 y.o.-year-old patient lying in the bed with no acute distress.  EYES: Pupils equal, round, reactive to  light and accommodation. No scleral icterus. Extraocular muscles intact.  HEENT: Head atraumatic, normocephalic. Oropharynx and nasopharynx clear.  NECK: Supple, no jugular venous distention. No thyroid enlargement, no tenderness.  LUNGS: Normal breath sounds bilaterally, no wheezing, rales,rhonchi or crepitation. No use of accessory muscles of respiration.  CARDIOVASCULAR: S1, S2 normal. No rubs, or gallops. 3/6 loud systolic murmur is present ABDOMEN: Soft, nontender, nondistended. Bowel sounds present. No organomegaly or mass.  EXTREMITIES: No pedal edema, cyanosis, or clubbing.  NEUROLOGIC: Cranial nerves II through XII are intact. Muscle strength 5/5 in all extremities. Sensation intact. Gait not checked.  PSYCHIATRIC: The patient is alert and oriented x 3.  SKIN: No obvious rash, lesion, or ulcer.    DATA REVIEW:   CBC  Recent Labs Lab 09/28/14 0519  WBC 5.6  HGB 10.2*  HCT 32.3*  PLT 163    Chemistries   Recent Labs Lab 09/25/14 1549  09/27/14 0442  NA  134*  < > 136  K 5.0  < > 3.9  CL 100*  < > 103  CO2 25  < > 26  GLUCOSE 163*  < > 95  BUN 49*  < > 33*  CREATININE 1.46*  < > 1.13*  CALCIUM 9.8  < > 9.7  AST 35  --   --   ALT 19  --   --   ALKPHOS 42  --   --   BILITOT 0.3  --   --   < > = values in this interval not displayed.  Cardiac Enzymes  Recent Labs Lab 09/26/14 0239  TROPONINI 0.10*    Microbiology Results  Results for orders placed or performed during the hospital encounter of 09/25/14  Urine culture     Status: None   Collection Time: 09/26/14  3:12 PM  Result Value Ref Range Status   Specimen Description URINE, RANDOM  Final   Special Requests NONE  Final   Culture >=100,000 COLONIES/mL ESCHERICHIA COLI  Final   Report Status 09/28/2014 FINAL  Final   Organism ID, Bacteria ESCHERICHIA COLI  Final      Susceptibility   Escherichia coli - MIC*    AMPICILLIN <=2 SENSITIVE Sensitive     CEFTAZIDIME <=1 SENSITIVE Sensitive     CEFAZOLIN <=4 SENSITIVE Sensitive     CEFTRIAXONE <=1 SENSITIVE Sensitive     CIPROFLOXACIN <=0.25 SENSITIVE Sensitive     GENTAMICIN <=1 SENSITIVE Sensitive     IMIPENEM <=0.25 SENSITIVE Sensitive     TRIMETH/SULFA <=20 SENSITIVE Sensitive     * >=100,000 COLONIES/mL ESCHERICHIA COLI    RADIOLOGY:  US Carotid Bilateral  09/28/2014   CLINICAL DATA:  81 year old female with several week history of intermittent syncope  EXAM: BILATERAL CAROTID DUPLEX ULTRASOUND  TECHNIQUE: Wallace Cullens scale imaging, color Doppler and duplex ultrasound were performed of bilateral carotid and vertebral arteries in the neck.  COMPARISON:  Head CT 09/25/2014 ; prior CTA scan of the neck 07/21/2004  FINDINGS: Criteria: Quantification of carotid stenosis is based on velocity parameters that correlate the residual internal carotid diameter with NASCET-based stenosis levels, using the diameter of the distal internal carotid lumen as the denominator for stenosis measurement.  The following velocity measurements were  obtained:  RIGHT  ICA:  57/19 cm/sec  CCA:  43/10 cm/sec  SYSTOLIC ICA/CCA RATIO:  1.3  DIASTOLIC ICA/CCA RATIO:  2.0  ECA:  63 cm/sec  LEFT  ICA:  56/14 cm/sec  CCA:  49/9 cm/sec  SYSTOLIC ICA/CCA RATIO:  1.1  DIASTOLIC ICA/CCA RATIO:  1.5  ECA:  57 cm/sec  RIGHT CAROTID ARTERY: Heterogeneous but focal and partially calcified atherosclerotic plaque in the carotid bifurcation extending into the proximal internal and external carotid arteries. By peak systolic velocity criteria the estimated stenosis remains less than 50%.  RIGHT VERTEBRAL ARTERY:  Patent with normal antegrade flow.  LEFT CAROTID ARTERY: Scant focal heterogeneous and partially calcified plaque in the proximal internal carotid artery. By peak systolic velocity criteria, the estimated stenosis remains less than 50%.  LEFT VERTEBRAL ARTERY: Patent with normal antegrade flow. Of note, the vertebral artery waveform was intermittently irregular.  IMPRESSION: 1. Mild (1-49%) stenosis proximal right internal carotid artery secondary to focal heterogeneous and partially calcified atherosclerotic plaque. 2. Mild (1-49%) stenosis proximal left internal carotid artery secondary to focal heterogeneous and partially calcified atherosclerotic plaque. 3. Comparing across modalities to prior CTA neck from 07/21/2004, there is been no significant interval progression of carotid artery disease. 4. Vertebral arteries remain patent with normal antegrade flow. 5. Intermittently irregular and tachycardic cardiac rhythm. Does the patient have a history of atrial fibrillation?  These results will be called to the ordering clinician or representative by the Radiologist Assistant, and communication documented in the PACS or zVision Dashboard.  Signed,  Sterling Big, MD  [facility]  [facility]   Electronically Signed   By: Malachy Moan M.D.   On: 09/28/2014 07:43    EKG:   Orders placed or performed during the  hospital encounter of 09/25/14  . ED EKG  . ED EKG  . EKG 12-Lead  . EKG 12-Lead      Management plans discussed with the patient, family and they are in agreement.  CODE STATUS:     Code Status Orders        Start     Ordered   09/25/14 2057  Full code   Continuous     09/25/14 2056    Advance Directive Documentation        Most Recent Value   Type of Advance Directive  Living will   Pre-existing out of facility DNR order (yellow form or pink MOST form)     "MOST" Form in Place?        TOTAL TIME TAKING CARE OF THIS PATIENT: 39 minutes.    Enid Baas M.D on 09/28/2014 at 10:44 AM  Between 7am to 6pm - Pager - [phone]  After 6pm go to [email or web address] - password [email or web address] [facility]  [facility]  Office  [phone]  CC: Primary care physician; Corky Downs, MD

## 2014-09-28 NOTE — Progress Notes (Signed)
[facility] Cardiology [facility] Encounter Note  Patient: Claudia Warren / Admit Date: 09/25/2014 / Date of Encounter: 09/28/2014, 8:55 AM   Subjective: Weakness and shortness of breath and no evidence of syncopal episodes Patient has some sinus tachycardia but no evidence of heart block at this point  Review of Systems: Positive for: Weakness and shortness of breath Negative for: Vision change, hearing change, syncope, dizziness, nausea, vomiting,diarrhea, bloody stool, stomach pain, cough, congestion, diaphoresis, urinary frequency, urinary pain,skin lesions, skin rashes Others previously listed  Objective: Telemetry: Atrial fibrillation with variable rate Physical Exam: Blood pressure 105/78, pulse 135, temperature 97.7 F (36.5 C), temperature source Oral, resp. rate 25, height  (1.6 m), weight 131 lb 8 oz (59.648 kg), SpO2 96 %. Body mass index is 23.3 kg/(m^2). General: Well developed, well nourished, in no acute distress. Head: Normocephalic, atraumatic, sclera non-icteric, no xanthomas, nares are without discharge. Neck: No apparent masses Lungs: Normal respirations with no wheezes, no rhonchi, no rales , no crackles   Heart: Irregular rate and rhythm, normal S1 soft S2, 4+ aortic murmur, no rub, no gallop, PMI is normal size and placement, carotid upstroke normal with bruit, jugular venous pressure normal Abdomen: Soft, non-tender, non-distended with normoactive bowel sounds. No hepatosplenomegaly. Abdominal aorta is normal size without bruit Extremities: Trace edema, no clubbing, no cyanosis, no ulcers,  Peripheral: 2+ radial, 2+ femoral, 2+ dorsal pedal pulses Neuro: Alert and oriented. Moves all extremities spontaneously. Psych:  Responds to questions appropriately with a normal affect.   Intake/Output Summary (Last 24 hours) at 09/28/14 0855 Last data filed at 09/28/14 0520  Gross per 24 hour  Intake    480 ml  Output   1900 ml  Net  -1420 ml    Inpatient  Medications:  . apixaban  2.5 mg Oral BID  . docusate sodium  200 mg Oral BID  . furosemide  40 mg Oral Daily  . gabapentin  100 mg Oral BID  . levofloxacin  250 mg Oral Daily  . levothyroxine  75 mcg Oral QAC breakfast  . loratadine  10 mg Oral Daily  . metoprolol tartrate  12.5 mg Oral BID  . pantoprazole  40 mg Oral QAC breakfast  . pramipexole  0.75 mg Oral QPM  . simvastatin  20 mg Oral QPM  . sodium chloride  3 mL Intravenous Q12H   Infusions:    Labs:  Recent Labs  09/26/14 0239 09/27/14 0442  NA 135 136  K 4.4 3.9  CL 104 103  CO2 25 26  GLUCOSE 104* 95  BUN 46* 33*  CREATININE 1.33* 1.13*  CALCIUM 9.4 9.7    Recent Labs  09/25/14 1549  AST 35  ALT 19  ALKPHOS 42  BILITOT 0.3  PROT 6.6  ALBUMIN 3.8    Recent Labs  09/25/14 1549 09/26/14 0239 09/28/14 0519  WBC 6.9 5.0 5.6  NEUTROABS 5.6  --   --   HGB 9.3* 8.6* 10.2*  HCT 29.7* 27.3* 32.3*  MCV 76.9* 76.8* 76.7*  PLT 166 144* 163    Recent Labs  09/25/14 1549 09/25/14 1917 09/25/14 2327 09/26/14 0239  TROPONINI 0.04* 0.07* 0.06* 0.10*   Invalid input(s): POCBNP No results for input(s): HGBA1C in the last 72 hours.   Weights: Filed Weights   09/25/14 1534 09/26/14 0441  Weight: 142 lb (64.411 kg) 131 lb 8 oz (59.648 kg)     Radiology/Studies:  Dg Chest 1 View  08/30/2014   CLINICAL DATA:  Shortness  of breath  EXAM: CHEST  1 VIEW  COMPARISON:  08/29/2014  FINDINGS: There is cardiomegaly with vascular congestion. Diffuse interstitial prominence has slightly improved. There are small bilateral pleural effusions with bibasilar atelectasis.  IMPRESSION: Improving interstitial edema pattern.  Mild edema persists.  Small bilateral pleural effusions with bibasilar atelectasis.   Electronically Signed   By: Charlett Nose M.D.   On: 08/30/2014 09:20   Ct Head Wo Contrast  09/25/2014   CLINICAL DATA:  Multiple syncopal episodes and dizziness.  EXAM: CT HEAD WITHOUT CONTRAST  TECHNIQUE:  Contiguous axial images were obtained from the base of the skull through the vertex without intravenous contrast.  COMPARISON:  None.  FINDINGS: There is generalized brain atrophy with commensurate dilatation of the ventricles and sulci. Mild chronic small vessel ischemic change noted within the deep periventricular and subcortical white matter regions bilaterally.  There is no mass, hemorrhage, edema, or other evidence of acute parenchymal abnormality. No extra-axial hemorrhage. No osseous abnormality. Visualized upper paranasal sinuses are clear. Mastoid air cells are clear. Superficial soft tissues are unremarkable.  IMPRESSION: 1. Atrophy and chronic ischemic changes in the white matter. 2. No evidence of acute intracranial abnormality. No intracranial mass, hemorrhage, or edema.   Electronically Signed   By: Bary Richard M.D.   On: 09/25/2014 16:09   US Carotid Bilateral  09/28/2014   CLINICAL DATA:  81 year old female with several week history of intermittent syncope  EXAM: BILATERAL CAROTID DUPLEX ULTRASOUND  TECHNIQUE: Wallace Cullens scale imaging, color Doppler and duplex ultrasound were performed of bilateral carotid and vertebral arteries in the neck.  COMPARISON:  Head CT 09/25/2014 ; prior CTA scan of the neck 07/21/2004  FINDINGS: Criteria: Quantification of carotid stenosis is based on velocity parameters that correlate the residual internal carotid diameter with NASCET-based stenosis levels, using the diameter of the distal internal carotid lumen as the denominator for stenosis measurement.  The following velocity measurements were obtained:  RIGHT  ICA:  57/19 cm/sec  CCA:  43/10 cm/sec  SYSTOLIC ICA/CCA RATIO:  1.3  DIASTOLIC ICA/CCA RATIO:  2.0  ECA:  63 cm/sec  LEFT  ICA:  56/14 cm/sec  CCA:  49/9 cm/sec  SYSTOLIC ICA/CCA RATIO:  1.1  DIASTOLIC ICA/CCA RATIO:  1.5  ECA:  57 cm/sec  RIGHT CAROTID ARTERY: Heterogeneous but focal and partially calcified atherosclerotic plaque in the carotid bifurcation  extending into the proximal internal and external carotid arteries. By peak systolic velocity criteria the estimated stenosis remains less than 50%.  RIGHT VERTEBRAL ARTERY:  Patent with normal antegrade flow.  LEFT CAROTID ARTERY: Scant focal heterogeneous and partially calcified plaque in the proximal internal carotid artery. By peak systolic velocity criteria, the estimated stenosis remains less than 50%.  LEFT VERTEBRAL ARTERY: Patent with normal antegrade flow. Of note, the vertebral artery waveform was intermittently irregular.  IMPRESSION: 1. Mild (1-49%) stenosis proximal right internal carotid artery secondary to focal heterogeneous and partially calcified atherosclerotic plaque. 2. Mild (1-49%) stenosis proximal left internal carotid artery secondary to focal heterogeneous and partially calcified atherosclerotic plaque. 3. Comparing across modalities to prior CTA neck from 07/21/2004, there is been no significant interval progression of carotid artery disease. 4. Vertebral arteries remain patent with normal antegrade flow. 5. Intermittently irregular and tachycardic cardiac rhythm. Does the patient have a history of atrial fibrillation?  These results will be called to the ordering clinician or representative by the Radiologist Assistant, and communication documented in the PACS or zVision Dashboard.  Signed,  Kandis Cocking.  [place] Asa, MD  [facility]  [facility]   Electronically Signed   By: Malachy Moan M.D.   On: 09/28/2014 07:43     Assessment and Recommendation  81 y.o. female with known severe aortic valve stenosis and chronic nonvalvular atrial fibrillation with acute episode of syncope of unknown etiology either possibly from aortic stenosis with rapid ventricular rate of atrial fibrillation and/or sick sinus syndrome. We have discussed at length both treatments of both issues and the patient wishes not to pursue intervention at this time.  Therefore if she may benefit from a single-wire pacemaker due to sick sinus syndrome with reinstatement of beta blocker for rapid ventricular rate she may be able to have this performed. We are watching telemetry to find whether the patient has this particular diagnosis and would benefit from this treatment 1. Add very low-dose of beta blocker today for further evaluation of control of heart rate and concerns of sick sinus syndrome 2. Ambulate and follow for further significant symptoms and possible single-wire pacemaker placement if patient continues to have sick sinus syndrome type symptoms and/or heart block for which she would benefit from single-wire pacer 3. Anticoagulation for further risk reduction in stroke with atrial fibrillation 4. Further evaluation of aortic valve stenosis if necessary depending on how patient wishes to approach above issues  Signed, Arnoldo Hooker M.D. FACC

## 2014-09-28 NOTE — Progress Notes (Signed)
Dr. Nemiah Commander paged regarding 11am vitals and SpO2 of 88% (increased to100% after 3 minutes). Pt is resting in chair, no complaints of SOB, dizziness, lightheadedness or pain, in no observable distress. MD aware, no new orders; proceed with discharge per MD. Ceasar Mons Vitals:   09/28/14 1109  BP: 88/47  Pulse: 64  Temp:   Resp:

## 2014-09-28 NOTE — Clinical Documentation Improvement (Signed)
Hospitalist  Can the diagnosis of  Chronic CHF be further specified?    Type - Systolic, Diastolic, Systolic and Diastolic  Other  Clinically Undetermined    Supporting Information: *Per MD notes: history of CHF -clincally patient is not in congestive heart failure presently. Continue home dose Lasix *Noted patient takes furosemide  daily at home*   Please exercise your independent, professional judgment when responding. A specific answer is not anticipated or expected.   Thank You,  Cruzita Lederer, RN CDS [employer]

## 2014-09-29 DIAGNOSIS — G4733 Obstructive sleep apnea (adult) (pediatric): Secondary | ICD-10-CM | POA: Diagnosis not present

## 2014-10-07 DIAGNOSIS — I480 Paroxysmal atrial fibrillation: Secondary | ICD-10-CM | POA: Diagnosis not present

## 2014-10-07 DIAGNOSIS — R55 Syncope and collapse: Secondary | ICD-10-CM | POA: Diagnosis not present

## 2014-10-14 ENCOUNTER — Other Ambulatory Visit: Payer: Medicare Other

## 2014-10-16 NOTE — OR Nursing (Signed)
Hilda Lias ( family member) manages Mrs. Claudia Warren medication Phone [phone]

## 2014-10-20 ENCOUNTER — Ambulatory Visit: Payer: Medicare Other | Admitting: Anesthesiology

## 2014-10-20 ENCOUNTER — Observation Stay
Admission: RE | Admit: 2014-10-20 | Discharge: 2014-10-21 | Disposition: A | Discharge status: Rehabilitation Facility | Payer: Medicare Other | Source: Ambulatory Visit | Attending: Cardiology | Admitting: Cardiology

## 2014-10-20 ENCOUNTER — Observation Stay: Payer: Medicare Other

## 2014-10-20 ENCOUNTER — Encounter: Payer: Self-pay | Admitting: *Deleted

## 2014-10-20 ENCOUNTER — Encounter
Admission: RE | Disposition: A | Discharge status: Rehabilitation Facility | Payer: Self-pay | Source: Ambulatory Visit | Attending: Cardiology

## 2014-10-20 ENCOUNTER — Ambulatory Visit: Payer: Medicare Other

## 2014-10-20 DIAGNOSIS — Z8249 Family history of ischemic heart disease and other diseases of the circulatory system: Secondary | ICD-10-CM | POA: Insufficient documentation

## 2014-10-20 DIAGNOSIS — H8109 Meniere's disease, unspecified ear: Secondary | ICD-10-CM | POA: Insufficient documentation

## 2014-10-20 DIAGNOSIS — Z823 Family history of stroke: Secondary | ICD-10-CM | POA: Insufficient documentation

## 2014-10-20 DIAGNOSIS — R001 Bradycardia, unspecified: Secondary | ICD-10-CM | POA: Insufficient documentation

## 2014-10-20 DIAGNOSIS — E039 Hypothyroidism, unspecified: Secondary | ICD-10-CM | POA: Diagnosis not present

## 2014-10-20 DIAGNOSIS — Z880 Allergy status to penicillin: Secondary | ICD-10-CM | POA: Insufficient documentation

## 2014-10-20 DIAGNOSIS — Z7901 Long term (current) use of anticoagulants: Secondary | ICD-10-CM | POA: Diagnosis not present

## 2014-10-20 DIAGNOSIS — G2581 Restless legs syndrome: Secondary | ICD-10-CM | POA: Insufficient documentation

## 2014-10-20 DIAGNOSIS — I480 Paroxysmal atrial fibrillation: Secondary | ICD-10-CM | POA: Insufficient documentation

## 2014-10-20 DIAGNOSIS — Z79899 Other long term (current) drug therapy: Secondary | ICD-10-CM | POA: Diagnosis not present

## 2014-10-20 DIAGNOSIS — I10 Essential (primary) hypertension: Secondary | ICD-10-CM | POA: Diagnosis not present

## 2014-10-20 DIAGNOSIS — R55 Syncope and collapse: Secondary | ICD-10-CM | POA: Insufficient documentation

## 2014-10-20 DIAGNOSIS — I495 Sick sinus syndrome: Principal | ICD-10-CM | POA: Insufficient documentation

## 2014-10-20 DIAGNOSIS — R531 Weakness: Secondary | ICD-10-CM | POA: Insufficient documentation

## 2014-10-20 DIAGNOSIS — R0602 Shortness of breath: Secondary | ICD-10-CM | POA: Insufficient documentation

## 2014-10-20 DIAGNOSIS — F419 Anxiety disorder, unspecified: Secondary | ICD-10-CM | POA: Diagnosis not present

## 2014-10-20 DIAGNOSIS — E785 Hyperlipidemia, unspecified: Secondary | ICD-10-CM | POA: Insufficient documentation

## 2014-10-20 DIAGNOSIS — I350 Nonrheumatic aortic (valve) stenosis: Secondary | ICD-10-CM | POA: Insufficient documentation

## 2014-10-20 DIAGNOSIS — I482 Chronic atrial fibrillation: Secondary | ICD-10-CM | POA: Diagnosis not present

## 2014-10-20 DIAGNOSIS — Z950 Presence of cardiac pacemaker: Secondary | ICD-10-CM | POA: Diagnosis not present

## 2014-10-20 DIAGNOSIS — I509 Heart failure, unspecified: Secondary | ICD-10-CM | POA: Insufficient documentation

## 2014-10-20 HISTORY — PX: PACEMAKER INSERTION: SHX728

## 2014-10-20 SURGERY — INSERTION, CARDIAC PACEMAKER
Anesthesia: General | Site: Chest | Wound class: Clean

## 2014-10-20 MED ORDER — LIDOCAINE 1 % OPTIME INJ - NO CHARGE
INTRAMUSCULAR | Status: DC | PRN
  Administered 2014-10-20: 30 mL

## 2014-10-20 MED ORDER — LACTATED RINGERS IV SOLN
INTRAVENOUS | Status: DC
  Administered 2014-10-20 (×2): via INTRAVENOUS

## 2014-10-20 MED ORDER — FENTANYL CITRATE (PF) 100 MCG/2ML IJ SOLN
INTRAMUSCULAR | Status: DC | PRN
  Administered 2014-10-20: 50 ug via INTRAVENOUS

## 2014-10-20 MED ORDER — MIDAZOLAM HCL 2 MG/2ML IJ SOLN
INTRAMUSCULAR | Status: DC | PRN
  Administered 2014-10-20: 1 mg via INTRAVENOUS

## 2014-10-20 MED ORDER — PHENYLEPHRINE HCL 10 MG/ML IJ SOLN
INTRAMUSCULAR | Status: DC | PRN
  Administered 2014-10-20 (×3): 100 ug via INTRAVENOUS

## 2014-10-20 MED ORDER — PANTOPRAZOLE SODIUM 40 MG PO TBEC
40.0000 mg | DELAYED_RELEASE_TABLET | Freq: Every day | ORAL | Status: DC
  Administered 2014-10-21: 40 mg via ORAL
  Filled 2014-10-20: qty 1

## 2014-10-20 MED ORDER — METOPROLOL TARTRATE 25 MG PO TABS
25.0000 mg | ORAL_TABLET | Freq: Two times a day (BID) | ORAL | Status: DC
  Administered 2014-10-20 – 2014-10-21 (×2): 25 mg via ORAL
  Filled 2014-10-20 (×3): qty 1

## 2014-10-20 MED ORDER — FUROSEMIDE 40 MG PO TABS
40.0000 mg | ORAL_TABLET | Freq: Every day | ORAL | Status: DC
  Administered 2014-10-20 – 2014-10-21 (×2): 40 mg via ORAL
  Filled 2014-10-20 (×2): qty 1

## 2014-10-20 MED ORDER — PROPOFOL 10 MG/ML IV BOLUS
INTRAVENOUS | Status: DC | PRN
  Administered 2014-10-20: 30 mg via INTRAVENOUS

## 2014-10-20 MED ORDER — ONDANSETRON HCL 4 MG/2ML IJ SOLN: 4.0000 mg | Freq: Once | INTRAMUSCULAR | Status: DC | PRN

## 2014-10-20 MED ORDER — PRAMIPEXOLE DIHYDROCHLORIDE 0.25 MG PO TABS
0.7500 mg | ORAL_TABLET | Freq: Three times a day (TID) | ORAL | Status: DC
  Administered 2014-10-20 – 2014-10-21 (×3): 0.75 mg via ORAL
  Filled 2014-10-20 (×3): qty 3

## 2014-10-20 MED ORDER — PROPOFOL INFUSION 10 MG/ML OPTIME
INTRAVENOUS | Status: DC | PRN
  Administered 2014-10-20: 50 ug/kg/min via INTRAVENOUS

## 2014-10-20 MED ORDER — ACETAMINOPHEN 325 MG PO TABS
325.0000 mg | ORAL_TABLET | ORAL | Status: DC | PRN
Start: 2014-10-20 — End: 2014-10-21

## 2014-10-20 MED ORDER — VANCOMYCIN HCL IN DEXTROSE 1-5 GM/200ML-% IV SOLN
INTRAVENOUS | Status: AC
  Filled 2014-10-20: qty 200

## 2014-10-20 MED ORDER — CLARITHROMYCIN 250 MG PO TABS
250.0000 mg | ORAL_TABLET | Freq: Two times a day (BID) | ORAL | Status: DC
  Administered 2014-10-20 – 2014-10-21 (×2): 250 mg via ORAL
  Filled 2014-10-20 (×5): qty 1

## 2014-10-20 MED ORDER — SODIUM CHLORIDE 0.9 % IR SOLN
Freq: Once | Status: AC
  Administered 2014-10-20: 2 mL
  Filled 2014-10-20: qty 2

## 2014-10-20 MED ORDER — VANCOMYCIN HCL IN DEXTROSE 1-5 GM/200ML-% IV SOLN
1000.0000 mg | Freq: Once | INTRAVENOUS | Status: AC
  Administered 2014-10-20: 1000 mg via INTRAVENOUS

## 2014-10-20 MED ORDER — GENTAMICIN SULFATE 40 MG/ML IJ SOLN
INTRAMUSCULAR | Status: AC
  Filled 2014-10-20: qty 2

## 2014-10-20 MED ORDER — SODIUM CHLORIDE 0.9 % IJ SOLN
INTRAMUSCULAR | Status: DC | PRN
  Administered 2014-10-20: 50 mL

## 2014-10-20 MED ORDER — GABAPENTIN 100 MG PO CAPS
100.0000 mg | ORAL_CAPSULE | Freq: Every day | ORAL | Status: DC
  Administered 2014-10-20: 100 mg via ORAL
  Filled 2014-10-20: qty 1

## 2014-10-20 MED ORDER — FENTANYL CITRATE (PF) 100 MCG/2ML IJ SOLN: 25.0000 ug | INTRAMUSCULAR | Status: DC | PRN

## 2014-10-20 MED ORDER — LEVOTHYROXINE SODIUM 75 MCG PO TABS
75.0000 ug | ORAL_TABLET | Freq: Every day | ORAL | Status: DC
  Administered 2014-10-21: 75 ug via ORAL
  Filled 2014-10-20: qty 1

## 2014-10-20 MED ORDER — ONDANSETRON HCL 4 MG/2ML IJ SOLN: 4.0000 mg | Freq: Four times a day (QID) | INTRAMUSCULAR | Status: DC | PRN

## 2014-10-20 MED ORDER — ALPRAZOLAM 0.5 MG PO TABS
0.2500 mg | ORAL_TABLET | Freq: Two times a day (BID) | ORAL | Status: DC | PRN
  Administered 2014-10-20: 0.25 mg via ORAL
  Filled 2014-10-20: qty 1

## 2014-10-20 MED ORDER — SPIRONOLACTONE 25 MG PO TABS
25.0000 mg | ORAL_TABLET | Freq: Every day | ORAL | Status: DC
  Administered 2014-10-20 – 2014-10-21 (×2): 25 mg via ORAL
  Filled 2014-10-20 (×2): qty 1

## 2014-10-20 SURGICAL SUPPLY — 32 items
BAG DECANTER FOR FLEXI CONT (MISCELLANEOUS) ×3 IMPLANT
BRUSH SCRUB 4% CHG (MISCELLANEOUS) ×3 IMPLANT
CABLE SURG 12 DISP A/V CHANNEL (MISCELLANEOUS) ×3 IMPLANT
CANISTER SUCT 1200ML W/VALVE (MISCELLANEOUS) ×3 IMPLANT
CHLORAPREP W/TINT 26ML (MISCELLANEOUS) ×3 IMPLANT
COVER LIGHT HANDLE STERIS (MISCELLANEOUS) ×6 IMPLANT
COVER MAYO STAND STRL (DRAPES) ×3 IMPLANT
DRAPE C-ARM XRAY 36X54 (DRAPES) ×3 IMPLANT
DRESSING TELFA 4X3 1S ST N-ADH (GAUZE/BANDAGES/DRESSINGS) ×3 IMPLANT
DRSG TEGADERM 4X4.75 (GAUZE/BANDAGES/DRESSINGS) ×3 IMPLANT
GLOVE BIO SURGEON STRL SZ7.5 (GLOVE) ×3 IMPLANT
GLOVE BIO SURGEON STRL SZ8 (GLOVE) ×3 IMPLANT
GOWN STRL REUS W/ TWL LRG LVL3 (GOWN DISPOSABLE) ×1 IMPLANT
GOWN STRL REUS W/ TWL XL LVL3 (GOWN DISPOSABLE) ×1 IMPLANT
GOWN STRL REUS W/TWL LRG LVL3 (GOWN DISPOSABLE) ×2
GOWN STRL REUS W/TWL XL LVL3 (GOWN DISPOSABLE) ×2
IMMOBILIZER SHDR MD LX WHT (SOFTGOODS) IMPLANT
IMMOBILIZER SHDR XL LX WHT (SOFTGOODS) IMPLANT
INTRO PACEMKR SHEATH II 7FR (MISCELLANEOUS) ×3
INTRODUCER PACEMKR SHTH II 7FR (MISCELLANEOUS) ×1 IMPLANT
IV NS 500ML (IV SOLUTION) ×2
IV NS 500ML BAXH (IV SOLUTION) ×1 IMPLANT
KIT RM TURNOVER STRD PROC AR (KITS) ×3 IMPLANT
LABEL OR SOLS (LABEL) ×3 IMPLANT
LEAD CAPSURE NOVUS 5076-52CM (Lead) ×3 IMPLANT
MARKER SKIN W/RULER 31145785 (MISCELLANEOUS) ×3 IMPLANT
PACK PACE INSERTION (MISCELLANEOUS) ×3 IMPLANT
PAD GROUND ADULT SPLIT (MISCELLANEOUS) ×3 IMPLANT
PAD STATPAD (MISCELLANEOUS) ×3 IMPLANT
PPM'ADVISA SR MRI A3SR01 (Pacemaker) ×1 IMPLANT
PPMADVISA SR MRI A3SR01 (Pacemaker) ×2 IMPLANT
SUT SILK 0 SH 30 (SUTURE) ×9 IMPLANT

## 2014-10-20 NOTE — Progress Notes (Signed)
Made aware by telemetry clerk patients heart rate increased to 150 now 115, upon entering room patient sitting up in bed eating dinner. No c/o pain patient asymptomatic. Will continue to monitor. Currently on metoprolol  po bid.

## 2014-10-20 NOTE — Transfer of Care (Signed)
Immediate Anesthesia Transfer of Care Note  Patient: Claudia Warren  Procedure(s) Performed: Procedure(s): INSERTION PACEMAKER (N/A)  Patient Location: PACU  Anesthesia Type:General  Level of Consciousness: sedated  Airway & Oxygen Therapy: Patient Spontanous Breathing and Patient connected to face mask oxygen  Post-op Assessment: Report given to RN and Post -op Vital signs reviewed and stable  Post vital signs: Reviewed and stable  Last Vitals:  Filed Vitals:   10/20/14 1436  BP: 95/74  Pulse: 94  Temp: 36.4 C  Resp: 15    Complications: No apparent anesthesia complications

## 2014-10-20 NOTE — H&P (Signed)
Progress Notes Encounter Date: 10/07/2014   Claudia Heckle, MD  Cardiovascular Disease  Expand All Collapse All   Established Patient Visit   Chief Complaint:     Chief Complaint  Patient presents with  . Hospital Follow Up    syncope with 30 loop monitor   Date of Service: 10/07/2014 Date of Birth: 1917-05-21 PCP: Claudia Downs, MD  History of Present Illness: Claudia Warren is a 81 y.o.female patient  Patient returns today after recent hospitalization at [facility] for episodes of syncope. She was admitted to the hospital the 1st time with significant bradycardia causing syncope and no evidence of myocardial infarction at that time. Echocardiogram had shown normal LV systolic function and some biatrial enlargement and mitral and tricuspid regurgitation. The patient had discontinuation of all of her beta-blocker and calcium channel blocker and had improvements of symptoms and no evidence of significant further problems. Then she is readmitted to the hospital several days later for atrial fibrillation with rapid ventricular rate. Under those conditions the patient continued on to have medication management and heart rate was improved. She did not have any further concerns until had a syncopal episode. His of which was not clear whether was atrial fibrillation with rapid ventricular rate and or bradycardia. During her hospitalization there was no issues with either and no signs of sick sinus syndrome. Therefore a 30 day Holter monitor was placed and did show multiple periods of symptomatic bradycardia with syncope as well as symptomatic atrial fibrillation with rapid ventricular rate needing further evaluation and treatment including the possibility of single-chamber pacemaker placement. The patient understands all of concerns listed above and would continue to pursue pacemaker due to sick sinus syndrome Past Medical and Surgical History  Past Medical History History  reviewed. No pertinent past medical history.  Past Surgical History She has no past surgical history on file.   Medications and Allergies  Current Medications   Current Medications         Current Outpatient Prescriptions  Medication Sig Dispense Refill  . ALPRAZolam (XANAX) 0.25 MG tablet     . apixaban (ELIQUIS) 2.5 mg tablet Take 2.5 mg by mouth once.    . FUROsemide (LASIX) 40 MG tablet     . gabapentin (NEURONTIN) 100 MG capsule     . levofloxacin (LEVAQUIN) 250 MG tablet     . levothyroxine (SYNTHROID, LEVOTHROID) 75 MCG tablet     . metoprolol tartrate (LOPRESSOR) 25 MG tablet     . omeprazole (PRILOSEC) 20 MG DR capsule     . pramipexole (MIRAPEX) 0.75 MG tablet     . simvastatin (ZOCOR) 20 MG tablet     . spironolactone (ALDACTONE) 25 MG tablet Take 25 mg by mouth once daily.     No current facility-administered medications for this visit.      Allergies: Penicillins and Sulfa (sulfonamide antibiotics)  Social and Family History  Social History  reports that she has never smoked. She does not have any smokeless tobacco history on file.  Family History History reviewed. No pertinent family history.  Review of Systems   Review of Systems  Positive for syncope Negative for weight gain weight loss, weakness, vision change, hearing loss, cough, congestion, PND, orthopnea, heartburn, nausea, diaphoresis, vomiting, diarrhea, bloody stool, melena, stomach pain, extremity pain, leg weakness, leg cramping, leg blood clots, headache, nosebleed, trouble swallowing, mouth pain, urinary frequency, urination at night, muscle weakness, skin lesions, skin rashes, numbness, anxiety, and/or depression Physical Examination  Vitals:BP 110/52 mmHg  Pulse 84  Resp 14  Wt 61.236 kg (135 lb) Ht:  Wt:61.236 kg (135 lb) ZOX:WRUEA is no height on file to calculate BSA. There is no height on file to calculate  BMI. Appearance: well appearing in no acute distress HEENT: Pupils equally reactive to light and accomodation, no apparent xanthalasma  Neck: Supple, no apparent thyromegaly, masses, or lymphadenopathy  Lungs: normal respiratory effort; few crackles, no rhonchi, no wheezes Heart: irregular rate and rhythm. Normal S1 S2 No gallops, 2+ mitral murmur, no rub, PMI is normal size and placement. carotid upstroke normal without bruit. Jugular venous pressure is normal Abdomen: soft, nontender, not distended with normal bowel sounds. No apparent hepatosplenomegally. Abdominal aorta is normal size without bruit Extremities: no edema, no ulcers, no clubbing, no cyanosis Peripheral Pulses: 2+ in upper extremities, 1+ femoral pulses bilaterally, 1+lower extremity  Musculoskeletal;  Normal muscle tone with kyphosis Neurological: Cranial nerves intact   Assessment   81 y.o. female with      Encounter Diagnoses  Name Primary?  . Syncope and collapse Yes  . Paroxysmal a-fib         Plan  -single-chamber pacemaker placement due to sick sinus syndrome with recurrent atrial fibrillation with rapid ventricular rate and episodes of symptomatic bradycardia with syncope. Patient understands risk and benefits of pacemaker placement. This includes the possibility dose stroke heart attack infection bleeding blood clot, pneumothorax hemopericardium and or reactions to medications. The patient is at low risk for general anesthesia -discontinuation of anticoagulation 3 days prior to of pacemaker placement due to concerns of bleeding complications -reinstatement of beta-blocker for heart rate control after above   No orders of the defined types were placed in this encounter.   No Follow-up on file.  Claudia Heckle, MD           Office Visit on 10/07/2014      Department  Name Address Phone Fax  [facility] [address] [place] [address]  [phone]  Service Location  Name Address      [facility] [address] [place] [address]

## 2014-10-20 NOTE — Anesthesia Postprocedure Evaluation (Signed)
  Anesthesia Post-op Note  Patient: Claudia Warren  Procedure(s) Performed: Procedure(s): INSERTION PACEMAKER (N/A)  Anesthesia type:General  Patient location: PACU  Post pain: Pain level controlled  Post assessment: Post-op Vital signs reviewed, Patient's Cardiovascular Status Stable, Respiratory Function Stable, Patent Airway and No signs of Nausea or vomiting  Post vital signs: Reviewed and stable  Last Vitals:  Filed Vitals:   10/20/14 1436  BP: 95/74  Pulse: 94  Temp: 36.4 C  Resp: 15    Level of consciousness: awake, alert  and patient cooperative  Complications: No apparent anesthesia complications

## 2014-10-20 NOTE — Anesthesia Procedure Notes (Signed)
Date/Time: 10/20/2014 1:37 PM Performed by: Stormy Fabian Pre-anesthesia Checklist: Patient identified, Emergency Drugs available, Suction available and Patient being monitored Patient Re-evaluated:Patient Re-evaluated prior to inductionOxygen Delivery Method: Simple face mask Intubation Type: IV induction

## 2014-10-20 NOTE — Op Note (Signed)
[facility] Cardiology   10/20/2014                     2:42 PM  PATIENT:  Claudia Warren    PRE-OPERATIVE DIAGNOSIS:  syncope  POST-OPERATIVE DIAGNOSIS:  Same  PROCEDURE:  INSERTION PACEMAKER  SURGEON:  [name],[name], MD    ANESTHESIA:     PREOPERATIVE INDICATIONS:  Claudia Warren is a  81 y.o. female with a diagnosis of syncope who failed conservative measures and elected for surgical management.    The risks benefits and alternatives were discussed with the patient preoperatively including but not limited to the risks of infection, bleeding, cardiopulmonary complications, the need for revision surgery, among others, and the patient was willing to proceed.   OPERATIVE PROCEDURE: The patient was brought to the operating room and a fasting state. The left pectoral region was prepped and draped in the usual sterile manner. Anesthesia was obtained 1% lidocaine locally. A 6 cm incision was performed over the left pectoral region. A pacemaker pocket was generated by electrocautery and blunt dissection. Access was obtained to the left subclavian vein by fine-needle aspiration. MRI compatible lead was positioned right ventricular apical septum under fluoroscopic guidance. After proper thresholds were obtained, the lead was sutured in place. Pacemaker pocket was irrigated with gentamicin solution. The lead was connected to an MRI compatible single-chamber rate-responsive pacemaker ( [id number] [id number] ). The pocket was closed with 2-0 and 4-0 Vicryl, respectively. Steri-Strips and pressure dressing were applied.

## 2014-10-20 NOTE — Anesthesia Preprocedure Evaluation (Signed)
Anesthesia Evaluation  Patient identified by MRN, date of birth, ID band Patient awake    Reviewed: Allergy & Precautions, NPO status , Patient's Chart, lab work & pertinent test results  Airway Mallampati: III       Dental no notable dental hx.    Pulmonary neg pulmonary ROS, shortness of breath,    Pulmonary exam normal        Cardiovascular hypertension, Pt. on home beta blockers +CHF  Normal cardiovascular exam+ dysrhythmias      Neuro/Psych Anxiety    GI/Hepatic negative GI ROS, Neg liver ROS,   Endo/Other  Hypothyroidism   Renal/GU      Musculoskeletal negative musculoskeletal ROS (+)   Abdominal Normal abdominal exam  (+)   Peds  Hematology negative hematology ROS (+)   Anesthesia Other Findings   Reproductive/Obstetrics                             Anesthesia Physical Anesthesia Plan  ASA: III  Anesthesia Plan: MAC   Post-op Pain Management:    Induction: Intravenous  Airway Management Planned: Nasal Cannula  Additional Equipment:   Intra-op Plan:   Post-operative Plan:   Informed Consent: I have reviewed the patients History and Physical, chart, labs and discussed the procedure including the risks, benefits and alternatives for the proposed anesthesia with the patient or authorized representative who has indicated his/her understanding and acceptance.     Plan Discussed with: CRNA  Anesthesia Plan Comments:         Anesthesia Quick Evaluation

## 2014-10-20 NOTE — Interval H&P Note (Signed)
History and Physical Interval Note:  10/20/2014 8:55 AM  Claudia Warren  has presented today for surgery, with the diagnosis of syncope  The various methods of treatment have been discussed with the patient and family. After consideration of risks, benefits and other options for treatment, the patient has consented to  Procedure(s): INSERTION PACEMAKER (N/A) as a surgical intervention .  The patient's history has been reviewed, patient examined, no change in status, stable for surgery.  I have reviewed the patient's chart and labs.  Questions were answered to the patient's satisfaction.     [name],[name]

## 2014-10-21 DIAGNOSIS — I482 Chronic atrial fibrillation: Secondary | ICD-10-CM | POA: Diagnosis not present

## 2014-10-21 DIAGNOSIS — G2581 Restless legs syndrome: Secondary | ICD-10-CM | POA: Diagnosis not present

## 2014-10-21 DIAGNOSIS — H8109 Meniere's disease, unspecified ear: Secondary | ICD-10-CM | POA: Diagnosis not present

## 2014-10-21 DIAGNOSIS — Z79899 Other long term (current) drug therapy: Secondary | ICD-10-CM | POA: Diagnosis not present

## 2014-10-21 DIAGNOSIS — I350 Nonrheumatic aortic (valve) stenosis: Secondary | ICD-10-CM | POA: Diagnosis not present

## 2014-10-21 DIAGNOSIS — I495 Sick sinus syndrome: Secondary | ICD-10-CM | POA: Diagnosis not present

## 2014-10-21 DIAGNOSIS — E785 Hyperlipidemia, unspecified: Secondary | ICD-10-CM | POA: Diagnosis not present

## 2014-10-21 DIAGNOSIS — I480 Paroxysmal atrial fibrillation: Secondary | ICD-10-CM | POA: Diagnosis not present

## 2014-10-21 DIAGNOSIS — I10 Essential (primary) hypertension: Secondary | ICD-10-CM | POA: Diagnosis not present

## 2014-10-21 DIAGNOSIS — Z8249 Family history of ischemic heart disease and other diseases of the circulatory system: Secondary | ICD-10-CM | POA: Diagnosis not present

## 2014-10-21 DIAGNOSIS — Z7901 Long term (current) use of anticoagulants: Secondary | ICD-10-CM | POA: Diagnosis not present

## 2014-10-21 DIAGNOSIS — F419 Anxiety disorder, unspecified: Secondary | ICD-10-CM | POA: Diagnosis not present

## 2014-10-21 DIAGNOSIS — E039 Hypothyroidism, unspecified: Secondary | ICD-10-CM | POA: Diagnosis not present

## 2014-10-21 DIAGNOSIS — R531 Weakness: Secondary | ICD-10-CM | POA: Diagnosis not present

## 2014-10-21 DIAGNOSIS — I509 Heart failure, unspecified: Secondary | ICD-10-CM | POA: Diagnosis not present

## 2014-10-21 DIAGNOSIS — Z880 Allergy status to penicillin: Secondary | ICD-10-CM | POA: Diagnosis not present

## 2014-10-21 DIAGNOSIS — R0602 Shortness of breath: Secondary | ICD-10-CM | POA: Diagnosis not present

## 2014-10-21 DIAGNOSIS — R001 Bradycardia, unspecified: Secondary | ICD-10-CM | POA: Diagnosis not present

## 2014-10-21 DIAGNOSIS — Z823 Family history of stroke: Secondary | ICD-10-CM | POA: Diagnosis not present

## 2014-10-21 DIAGNOSIS — R55 Syncope and collapse: Secondary | ICD-10-CM | POA: Diagnosis not present

## 2014-10-21 MED ORDER — METOPROLOL TARTRATE 25 MG PO TABS: 25.0000 mg | ORAL_TABLET | Freq: Two times a day (BID) | ORAL | Status: AC

## 2014-10-21 MED ORDER — ALPRAZOLAM 0.25 MG PO TABS: 0.2500 mg | ORAL_TABLET | Freq: Two times a day (BID) | ORAL | Status: AC | PRN

## 2014-10-21 MED ORDER — CLARITHROMYCIN 250 MG PO TABS: 250.0000 mg | ORAL_TABLET | Freq: Two times a day (BID) | ORAL | Status: AC

## 2014-10-21 NOTE — Clinical Social Work Note (Signed)
CSW notified RN, pt, pt's niece and facility that pt would DC today via Niece to [facility].  CSW signing off.

## 2014-10-21 NOTE — Clinical Social Work Note (Signed)
Clinical Social Work Assessment  Patient Details  Name: SHEELA MCCULLEY MRN: [id number] Date of Birth: [birth year]  Date of referral:  10/21/14               Reason for consult:  Facility Placement                Permission sought to share information with:  Facility Medical sales representative, Family Supports Permission granted to share information::  Yes, Verbal Permission Granted  Name::     Hilda Lias Niece [phone]  Agency::  [facility]  Relationship::     Contact Information:     Housing/Transportation Living arrangements for the past 2 months:  [facility] Independent living Source of Information:  Patient, Medical Team, Other (Comment Required) (Niece) Patient Interpreter Needed:  None Criminal Activity/Legal Involvement Pertinent to Current Situation/Hospitalization:  No - Comment as needed Significant Relationships:  Other Family Members Lives with:  Facility Resident Do you feel safe going back to the place where you live?  No Need for family participation in patient care:  No (Coment)  Care giving concerns:  Pt and pt's niece were concerned about being able to get into [facility].  CSW was able to confirm that they would be able to DC to this facility for STR.     Social Worker assessment / plan:  CSW spoke to pt and her niece.  Pt has been an Independent living and more recently has been using a wheel chair for ambulation.  Pt stated that she needed more help than she had at [facility].  She has been a resident there for 2.5 years and used to use a walker for ambulation.  SHe is currently in favor of [facility] for DC  Employment status:  Disabled (Comment on whether or not currently receiving Disability), Retired Health and safety inspector:  Medicare PT Recommendations:  Skilled Nursing Facility Information / Referral to community resources:  Skilled Nursing Facility  Patient/Family's Response to care:  Pt and pt's niece thanked CSW and were in agreement with DC to  SNF  Patient/Family's Understanding of and Emotional Response to Diagnosis, Current Treatment, and Prognosis:  Both verbalized their understanding and were in favor of DC to [facility].  Emotional Assessment Appearance:  Appears younger than stated age Attitude/Demeanor/Rapport:   (Pleasent ) Affect (typically observed):  Appropriate Orientation:  Oriented to Self, Oriented to Place, Oriented to  Time, Oriented to Situation Alcohol / Substance use:  Never Used Psych involvement (Current and /or in the community):  No (Comment)  Discharge Needs  Concerns to be addressed:  Care Coordination Readmission within the last 30 days:  Yes Current discharge risk:  Physical Impairment Barriers to Discharge:  No Barriers Identified   Chauncy Passy, LCSW 10/21/2014, 3:00 PM

## 2014-10-21 NOTE — Discharge Summary (Signed)
[facility] - [place] at [facility]   PATIENT NAME: Claudia Warren    MR#:  [id number]  DATE OF BIRTH:  Jun 09, 1917  DATE OF ADMISSION:  10/20/2014 ADMITTING PHYSICIAN: Marcina Millard, MD  DATE OF DISCHARGE:10/21/2014  PRIMARY CARE PHYSICIAN: MASOUD,JAVED, MD    ADMISSION DIAGNOSIS:  syncope  DISCHARGE DIAGNOSIS:  Active Problems:   Status post cardiac pacemaker procedure   SECONDARY DIAGNOSIS:   Past Medical History  Diagnosis Date  . CHF (congestive heart failure)   . Hypertension   . Anxiety   . Hypothyroidism   . Atrial fibrillation   . Edema   . Restless leg   . Meniere disease     HOSPITAL COURSE:   Patient had repeated episodes of syncopal with bradycardia in last one month, so she was admitted by cardiologist to have the pacemaker placement. Because of her overall generalized weakness and needing more assistance she is decided to go to rehabilitation at the time of discharge. Pacemaker placement is done. We were holding her anticoagulation 3 days prior to placement which is being resumed at the time of discharge.  For other medical issues as CHF, hypertension, anxiety, hypothyroidism, atrial fibrillation, restless leg syndrome she was continued on her home medications and she remained stable in the hospital.  DISCHARGE CONDITIONS:   Stable.  CONSULTS OBTAINED:     DRUG ALLERGIES:   Allergies  Allergen Reactions  . Penicillins Rash  . Sulfa Antibiotics Rash    DISCHARGE MEDICATIONS:   Current Discharge Medication List    START taking these medications   Details  clarithromycin (BIAXIN) 250 MG tablet Take 1 tablet (250 mg total) by mouth every 12 (twelve) hours. Qty: 14 tablet, Refills: 0      CONTINUE these medications which have CHANGED   Details  ALPRAZolam (XANAX) 0.25 MG tablet Take 1 tablet (0.25 mg total) by mouth 2 (two) times daily as needed for anxiety. Qty: 30 tablet, Refills: 0    metoprolol tartrate  (LOPRESSOR) 25 MG tablet Take 1 tablet (25 mg total) by mouth 2 (two) times daily. Qty: 60 tablet, Refills: 0      CONTINUE these medications which have NOT CHANGED   Details  apixaban (ELIQUIS) 2.5 MG TABS tablet Take 1 tablet (2.5 mg total) by mouth 2 (two) times daily. Qty: 60 tablet, Refills: 2    colchicine 0.6 MG tablet Take 0.6 mg by mouth daily.    docusate sodium (COLACE) 100 MG capsule Take 2 capsules (200 mg total) by mouth 2 (two) times daily. Qty: 10 capsule, Refills: 0    furosemide (LASIX) 40 MG tablet Take 40 mg by mouth daily.     gabapentin (NEURONTIN) 100 MG capsule Take 100 mg by mouth 2 (two) times daily.    levothyroxine (SYNTHROID, LEVOTHROID) 75 MCG tablet Take 75 mcg by mouth daily before breakfast.     loratadine (CLARITIN) 10 MG tablet Take 10 mg by mouth daily.    meclizine (ANTIVERT) 12.5 MG tablet Take 12.5 mg by mouth 2 (two) times daily as needed for dizziness.    omeprazole (PRILOSEC) 20 MG capsule Take 20 mg by mouth daily.     polyethylene glycol (MIRALAX / GLYCOLAX) packet Take 17 g by mouth daily as needed for mild constipation. Qty: 14 each, Refills: 0    pramipexole (MIRAPEX) 0.75 MG tablet Take 0.75 mg by mouth every evening.    simvastatin (ZOCOR) 20 MG tablet Take 20 mg by mouth every evening.  spironolactone (ALDACTONE) 25 MG tablet Take 1 tablet by mouth daily.      STOP taking these medications     levofloxacin (LEVAQUIN) 250 MG tablet          DISCHARGE INSTRUCTIONS:    Follow with cardiology clinic in 1 week.  If you experience worsening of your admission symptoms, develop shortness of breath, life threatening emergency, suicidal or homicidal thoughts you must seek medical attention immediately by calling 911 or calling your MD immediately  if symptoms less severe.  You Must read complete instructions/literature along with all the possible adverse reactions/side effects for all the Medicines you take and that have  been prescribed to you. Take any new Medicines after you have completely understood and accept all the possible adverse reactions/side effects.   Please note  You were cared for by a hospitalist during your hospital stay. If you have any questions about your discharge medications or the care you received while you were in the hospital after you are discharged, you can call the unit and asked to speak with the hospitalist on call if the hospitalist that took care of you is not available. Once you are discharged, your primary care physician will handle any further medical issues. Please note that NO REFILLS for any discharge medications will be authorized once you are discharged, as it is imperative that you return to your primary care physician (or establish a relationship with a primary care physician if you do not have one) for your aftercare needs so that they can reassess your need for medications and monitor your lab values.    Today   CHIEF COMPLAINT:  No chief complaint on file.   HISTORY OF PRESENT ILLNESS:   Claudia Warren is a 81 y.o.female patient  Patient returns today after recent hospitalization at [facility] for episodes of syncope. She was admitted to the hospital the 1st time with significant bradycardia causing syncope and no evidence of myocardial infarction at that time. Echocardiogram had shown normal LV systolic function and some biatrial enlargement and mitral and tricuspid regurgitation. The patient had discontinuation of all of her beta-blocker and calcium channel blocker and had improvements of symptoms and no evidence of significant further problems. Then she is readmitted to the hospital several days later for atrial fibrillation with rapid ventricular rate. Under those conditions the patient continued on to have medication management and heart rate was improved. She did not have any further concerns until had a syncopal episode. His of which was not  clear whether was atrial fibrillation with rapid ventricular rate and or bradycardia. During her hospitalization there was no issues with either and no signs of sick sinus syndrome. Therefore a 30 day Holter monitor was placed and did show multiple periods of symptomatic bradycardia with syncope as well as symptomatic atrial fibrillation with rapid ventricular rate needing further evaluation and treatment including the possibility of single-chamber pacemaker placement. The patient understands all of concerns listed above and would continue to pursue pacemaker due to sick sinus syndrome   VITAL SIGNS:  Blood pressure 98/42, pulse 65, temperature 98.4 F (36.9 C), temperature source Oral, resp. rate 20, height 5' (1.524 m), weight 59.92 kg (132 lb 1.6 oz), SpO2 99 %.  I/O:   Intake/Output Summary (Last 24 hours) at 10/21/14 1454 Last data filed at 10/21/14 1132  Gross per 24 hour  Intake    580 ml  Output    451 ml  Net    129 ml  PHYSICAL EXAMINATION:   General: Well developed, well nourished, in no acute distress HEENT: Normocephalic and atramatic Neck: No JVD.  Lungs: Clear bilaterally to auscultation and percussion. Heart: HRRR . Normal S1 and S2 without gallops or murmurs. Pacemaker site appears well healing Abdomen: Bowel sounds are positive, abdomen soft and non-tender  Msk: Back normal, normal gait. Normal strength and tone for age. Extremities: No clubbing, cyanosis or edema.  Neuro: Alert and oriented X 3. Psych: Good affect, responds appropriately  DATA REVIEW:   CBC No results for input(s): WBC, HGB, HCT, PLT in the last 168 hours.  Chemistries  No results for input(s): NA, K, CL, CO2, GLUCOSE, BUN, CREATININE, CALCIUM, MG, AST, ALT, ALKPHOS, BILITOT in the last 168 hours.  Invalid input(s): GFRCGP  Cardiac Enzymes No results for input(s): TROPONINI in the last 168 hours.  Microbiology Results  Results for orders placed or performed during the hospital  encounter of 09/25/14  Urine culture     Status: None   Collection Time: 09/26/14  3:12 PM  Result Value Ref Range Status   Specimen Description URINE, RANDOM  Final   Special Requests NONE  Final   Culture >=100,000 COLONIES/mL ESCHERICHIA COLI  Final   Report Status 09/28/2014 FINAL  Final   Organism ID, Bacteria ESCHERICHIA COLI  Final      Susceptibility   Escherichia coli - MIC*    AMPICILLIN <=2 SENSITIVE Sensitive     CEFTAZIDIME <=1 SENSITIVE Sensitive     CEFAZOLIN <=4 SENSITIVE Sensitive     CEFTRIAXONE <=1 SENSITIVE Sensitive     CIPROFLOXACIN <=0.25 SENSITIVE Sensitive     GENTAMICIN <=1 SENSITIVE Sensitive     IMIPENEM <=0.25 SENSITIVE Sensitive     TRIMETH/SULFA <=20 SENSITIVE Sensitive     * >=100,000 COLONIES/mL ESCHERICHIA COLI    RADIOLOGY:  Dg Chest Port 1 View  10/20/2014   CLINICAL DATA:  Post pacemaker insertion  EXAM: PORTABLE CHEST - 1 VIEW  COMPARISON:  Portable chest x-ray of 08/30/2014  FINDINGS: A single lead permanent pacemaker is now present. No pneumothorax is seen. Moderate cardiomegaly is stable. No bony abnormality is seen.  IMPRESSION: Permanent pacemaker now present.  No pneumothorax.   Electronically Signed   By: Dwyane Dee M.D.   On: 10/20/2014 15:57   Dg C-arm 1-60 Min-no Report  10/20/2014   CLINICAL DATA: surgery; pacemaker insertion   C-ARM 1-60 MINUTES  Fluoroscopy was utilized by the requesting physician.  No radiographic  interpretation.     EKG:    Management plans discussed with the patient, family and they are in agreement.  CODE STATUS:     Code Status Orders        Start     Ordered   10/20/14 1646  Full code   Continuous     10/20/14 1645    Advance Directive Documentation        Most Recent Value   Type of Advance Directive  Living will, Healthcare Power of Attorney   Pre-existing out of facility DNR order (yellow form or pink MOST form)     "MOST" Form in Place?        TOTAL TIME TAKING CARE OF THIS PATIENT:  35 minutes.    Altamese Dilling M.D on 10/21/2014 at 2:54 PM  Between 7am to 6pm - Pager - [phone]  After 6pm go to [email or web address] - password [email or web address] [facility]  [place]  [facility]  Office  [phone]  CC: Primary care physician; Corky Downs, MD  Note: This dictation was prepared with Dragon dictation along with smaller phrase technology. Any transcriptional errors that result from this process are unintentional.

## 2014-10-21 NOTE — Progress Notes (Signed)
[facility] Cardiology  SUBJECTIVE: I'm feeling better   Filed Vitals:   10/20/14 1658 10/20/14 2026 10/21/14 0544 10/21/14 0735  BP: 125/40 107/47 108/43 122/49  Pulse: 68 108 62   Temp: 98.4 F (36.9 C) 98.1 F (36.7 C) 98 F (36.7 C)   TempSrc: Oral Oral Oral   Resp: Height: 5' (1.524 m)     Weight: 59.92 kg (132 lb 1.6 oz)     SpO2: 93% 100% 100%      Intake/Output Summary (Last 24 hours) at 10/21/14 0818 Last data filed at 10/21/14 4098  Gross per 24 hour  Intake    300 ml  Output    251 ml  Net     49 ml      PHYSICAL EXAM  General: Well developed, well nourished, in no acute distress HEENT:  Normocephalic and atramatic Neck:  No JVD.  Lungs: Clear bilaterally to auscultation and percussion. Heart: HRRR . Normal S1 and S2 without gallops or murmurs. Pacemaker site appears well healing Abdomen: Bowel sounds are positive, abdomen soft and non-tender  Msk:  Back normal, normal gait. Normal strength and tone for age. Extremities: No clubbing, cyanosis or edema.   Neuro: Alert and oriented X 3. Psych:  Good affect, responds appropriately   LABS: Basic Metabolic Panel: No results for input(s): NA, K, CL, CO2, GLUCOSE, BUN, CREATININE, CALCIUM, MG, PHOS in the last 72 hours. Liver Function Tests: No results for input(s): AST, ALT, ALKPHOS, BILITOT, PROT, ALBUMIN in the last 72 hours. No results for input(s): LIPASE, AMYLASE in the last 72 hours. CBC: No results for input(s): WBC, NEUTROABS, HGB, HCT, MCV, PLT in the last 72 hours. Cardiac Enzymes: No results for input(s): CKTOTAL, CKMB, CKMBINDEX, TROPONINI in the last 72 hours. BNP: Invalid input(s): POCBNP D-Dimer: No results for input(s): DDIMER in the last 72 hours. Hemoglobin A1C: No results for input(s): HGBA1C in the last 72 hours. Fasting Lipid Panel: No results for input(s): CHOL, HDL, LDLCALC, TRIG, CHOLHDL, LDLDIRECT in the last 72 hours. Thyroid Function Tests: No results for input(s): TSH,  T4TOTAL, T3FREE, THYROIDAB in the last 72 hours.  Invalid input(s): FREET3 Anemia Panel: No results for input(s): VITAMINB12, FOLATE, FERRITIN, TIBC, IRON, RETICCTPCT in the last 72 hours.  Dg Chest Port 1 View  10/20/2014   CLINICAL DATA:  Post pacemaker insertion  EXAM: PORTABLE CHEST - 1 VIEW  COMPARISON:  Portable chest x-ray of 08/30/2014  FINDINGS: A single lead permanent pacemaker is now present. No pneumothorax is seen. Moderate cardiomegaly is stable. No bony abnormality is seen.  IMPRESSION: Permanent pacemaker now present.  No pneumothorax.   Electronically Signed   By: Dwyane Dee M.D.   On: 10/20/2014 15:57   Dg C-arm 1-60 Min-no Report  10/20/2014   CLINICAL DATA: surgery; pacemaker insertion   C-ARM 1-60 MINUTES  Fluoroscopy was utilized by the requesting physician.  No radiographic  interpretation.      Echo   TELEMETRY: Intermittent ventricular pacing:  ASSESSMENT AND PLAN:  Active Problems:   Status post cardiac pacemaker procedure    1. Sick sinus syndrome status post single-chamber pacemaker 2. Paroxysmal atrial fibrillation  Recommendations  1. 7-day course of Biaxin 250 mg twice a day 2. Internal medicine consult 3. Social worker consult to determine facility placement. Patient's family members feel that they can no longer take care of the patient at home.   Marcina Millard, MD, PhD, [facility] 10/21/2014 8:18 AM

## 2014-10-21 NOTE — Consult Note (Signed)
[facility] - [place] [facility]   PATIENT NAME: Claudia Warren    MR#:  [id number]  DATE OF BIRTH:  02/23/1917 Referring physician - Dr. Darrold Junker  SUBJECTIVE:  CHIEF COMPLAINT:  No chief complaint on file.  had 2 episodes of syncope due to bradycardia in last 1 month, and so was scheduled by cardiologist to have a pacemaker placement. Pacemaker is placed yesterday and because of her generalized weakness she requires to go to a rehabilitation for further management medical consult was called in by cardiologist to help in general medical issue management and help with the arrangement of rehabilitation. Patient denies any complaint.  REVIEW OF SYSTEMS:  CONSTITUTIONAL: No fever, positive for fatigue or weakness.  EYES: No blurred or double vision.  EARS, NOSE, AND THROAT: No tinnitus or ear pain.  RESPIRATORY: No cough, shortness of breath, wheezing or hemoptysis.  CARDIOVASCULAR: No chest pain, orthopnea, edema.  GASTROINTESTINAL: No nausea, vomiting, diarrhea or abdominal pain.  GENITOURINARY: No dysuria, hematuria.  ENDOCRINE: No polyuria, nocturia,  HEMATOLOGY: No anemia, easy bruising or bleeding SKIN: No rash or lesion. MUSCULOSKELETAL: No joint pain or arthritis.   NEUROLOGIC: No tingling, numbness, weakness.  PSYCHIATRY: No anxiety or depression.   ROS   PAST MEDICAL HISTORY:   Past Medical History  Diagnosis Date  . CHF (congestive heart failure)   . Hypertension   . Anxiety   . Hypothyroidism   . Atrial fibrillation   . Edema   . Restless leg   . Meniere disease     PAST SURGICAL HISTORY:   Past Surgical History  Procedure Laterality Date  . Abdominal hysterectomy    . Cholecystectomy    . Cataract extraction N/A   . Thyroid surgery N/A     SOCIAL HISTORY:   Social History  Substance Use Topics  . Smoking status: Never Smoker   . Smokeless tobacco: Never  Used  . Alcohol Use: No    FAMILY HISTORY:   Family History  Problem Relation Age of Onset  . CVA Mother   . CAD Father   . CAD Sister   . CAD Brother     DRUG ALLERGIES:   Allerg ies  Allergen Reactions  . Penicillins Rash  . Sulfa Antibiotics Rash       Prior to Admission medications   Medication Sig Start Date End Date Taking? Authorizing Provider  ALPRAZolam (XANAX) 0.25 MG tablet Take 1 tablet (0.25 mg total) by mouth every morning. Patient taking differently: Take 0.25 mg by mouth at bedtime.  09/01/14  Yes Auburn Bilberry, MD  docusate sodium (COLACE) 100 MG capsule Take 2 capsules (200 mg total) by mouth 2 (two) times daily. 09/01/14  Yes Auburn Bilberry, MD  furosemide (LASIX) 40 MG tablet Take 40 mg by mouth daily.    Yes Historical Provider, MD  gabapentin (NEURONTIN) 100 MG capsule Take 100 mg by mouth 2 (two) times daily.   Yes Historical Provider, MD  levothyroxine (SYNTHROID, LEVOTHROID) 75 MCG tablet Take 75 mcg by mouth daily before breakfast.    Yes Historical Provider, MD  loratadine (CLARITIN) 10 MG tablet Take 10 mg by mouth daily.   Yes Historical Provider, MD  meclizine (ANTIVERT) 12.5 MG tablet Take 12.5 mg by mouth 2 (two) times daily as needed for dizziness.   Yes Historical Provider, MD  omeprazole (PRILOSEC) 20 MG capsule Take 20 mg by mouth daily.    Yes Historical Provider, MD  polyethylene glycol (MIRALAX / GLYCOLAX) packet  Take 17 g by mouth daily as needed for mild constipation. 09/01/14  Yes Auburn Bilberry, MD  pramipexole (MIRAPEX) 0.75 MG tablet Take 0.75 mg by mouth every evening.   Yes Historical Provider, MD  simvastatin (ZOCOR) 20 MG tablet Take 20 mg by mouth every evening.    Yes Historical Provider, MD  spironolactone (ALDACTONE) 25 MG tablet Take 1 tablet by mouth daily.   Yes Historical Provider, MD  Eliquis 2.5 mg BID       DRUG ALLERGIES:   Allergies  Allergen Reactions  . Penicillins Rash  . Sulfa Antibiotics Rash    VITALS:  Blood pressure 98/42, pulse 65, temperature 98.4 F (36.9 C), temperature source Oral, resp. rate 20, height 5' (1.524 m), weight 59.92 kg (132 lb 1.6 oz), SpO2 99 %.  PHYSICAL EXAMINATION:  GENERAL:  81 y.o.-year-old patient lying in the bed with no acute distress.  EYES: Pupils equal, round, reactive to light and accommodation. No scleral icterus. Extraocular muscles intact.  HEENT: Head atraumatic, normocephalic. Oropharynx and nasopharynx clear.  NECK:  Supple, no jugular venous distention. No thyroid enlargement, no tenderness.  LUNGS: Normal breath sounds bilaterally, no wheezing, rales,rhonchi or crepitation. No use of accessory muscles of respiration.  CARDIOVASCULAR: S1, S2 normal. No murmurs, rubs, or gallops. Pacemaker in place. ABDOMEN: Soft, nontender, nondistended. Bowel sounds present. No organomegaly or mass.  EXTREMITIES: No pedal edema, cyanosis, or clubbing.  NEUROLOGIC: Cranial nerves II through XII are intact. Muscle strength 5/5 in all extremities. Sensation intact. Gait not checked.  PSYCHIATRIC: The patient is alert and oriented x 3.  SKIN: No obvious rash, lesion, or ulcer.   Physical Exam LABORATORY PANEL:   CBC No results for input(s): WBC, HGB, HCT, PLT in the last 168 hours. ------------------------------------------------------------------------------------------------------------------  Chemistries  No results for input(s): NA, K, CL, CO2, GLUCOSE, BUN, CREATININE, CALCIUM, MG, AST, ALT, ALKPHOS, BILITOT in the last 168 hours.  Invalid input(s): GFRCGP ------------------------------------------------------------------------------------------------------------------  Cardiac Enzymes No results for input(s): TROPONINI in the last 168  hours. ------------------------------------------------------------------------------------------------------------------  RADIOLOGY:  Dg Chest Port 1 View  10/20/2014   CLINICAL DATA:  Post pacemaker insertion  EXAM: PORTABLE CHEST - 1 VIEW  COMPARISON:  Portable chest x-ray of 08/30/2014  FINDINGS: A single lead permanent pacemaker is now present. No pneumothorax is seen. Moderate cardiomegaly is stable. No bony abnormality is seen.  IMPRESSION: Permanent pacemaker now present.  No pneumothorax.   Electronically Signed   By: Dwyane Dee M.D.   On: 10/20/2014 15:57   Dg C-arm 1-60 Min-no Report  10/20/2014   CLINICAL DATA: surgery; pacemaker insertion   C-ARM 1-60 MINUTES  Fluoroscopy was utilized by the requesting physician.  No radiographic  interpretation.     ASSESSMENT AND PLAN:   * Bradycardia and syncopal episodes   Status post pacemaker on telemetry management   *  severe aortic stenosis   Manage per cardiology.   * Hypothyroidism   Continue Synthroid   * History of CHF   No acute symptoms , continue Lasix   * Chronic atrial fibrillation   Rate is controlled, continue Eliquis.  It was held  for pacemaker placement for last 3 days now we can resume.    * Hyperlipidemia   Continue simvastatin.   * Generalized weakness    need rehabilitation placement, social worker to help with that.  All the records are reviewed and case discussed with Care Management/Social Workerr. Management plans discussed with the patient, family and they are in agreement.  CODE STATUS: full  TOTAL TIME TAKING CARE OF THIS PATIENT: 40 minutes.     POSSIBLE D/C IN 1-2 DAYS, DEPENDING ON CLINICAL CONDITION.   Altamese Dilling M.D on 10/21/2014   Between 7am to 6pm - Pager - [phone]  After 6pm go to [email or web address] - password [email or web address]  [facility]  Office  [phone]  CC: Primary care physician; Corky Downs, MD  Note: This dictation was prepared with  Dragon dictation along with smaller phrase technology. Any transcriptional errors that result from this process are unintentional.

## 2014-10-21 NOTE — Clinical Social Work Placement (Signed)
   CLINICAL SOCIAL WORK PLACEMENT  NOTE  Date:  10/21/2014  Patient Details  Name: Claudia Warren MRN: [id number] Date of Birth: 11-24-1917  Clinical Social Work is seeking post-discharge placement for this patient at the Skilled  Nursing Facility level of care (*CSW will initial, date and re-position this form in  chart as items are completed):  Yes   Patient/family provided with [facility] Clinical Social Work Department's list of facilities offering this level of care within the geographic area requested by the patient (or if unable, by the patient's family).  Yes   Patient/family informed of their freedom to choose among providers that offer the needed level of care, that participate in Medicare, Medicaid or managed care program needed by the patient, have an available bed and are willing to accept the patient.  Yes   Patient/family informed of 's ownership interest in [facility] and [facility], as well as of the fact that they are under no obligation to receive care at these facilities.  PASRR submitted to EDS on       PASRR number received on       Existing PASRR number confirmed on 10/21/14     FL2 transmitted to all facilities in geographic area requested by pt/family on 10/21/14     FL2 transmitted to all facilities within larger geographic area on       Patient informed that his/her managed care company has contracts with or will negotiate with certain facilities, including the following:            Patient/family informed of bed offers received.  Patient [facility]     Physician recommends and patient chooses bed at      Patient to be transferred to  [facility] on 10/21/14.  Patient to be transferred to facility by  (Niece )     Patient family notified on 10/21/14 of transfer.  Name of family member notified:   Hilda Lias)     PHYSICIAN       Additional Comment:     _______________________________________________ Chauncy Passy, LCSW 10/21/2014, 3:08 PM

## 2014-10-21 NOTE — Evaluation (Signed)
Physical Therapy Evaluation Patient Details Name: Claudia Warren MRN: [id number] DOB: 1917/03/24 Today's Date: 10/21/2014   History of Present Illness  Pt is a 81 y.o. female s/p pacemaker 10/20/14 secondary to sick sinus syndrome and paroxysmal a-fib.  Clinical Impression  Currently pt demonstrates impairments with strength, balance, activity tolerance, and limitations with functional mobility.  Prior to the last 2 1/2 weeks, pt was independent with rollator; the past 2 1/2 weeks pt has required 24/7 assist from family and has been needing to use w/c d/t unable to safely ambulate.  Pt lives at Independent Living facility at [facility].  Currently pt is min to mod assist with bed mobility and min assist with transfers and short ambulation distance bed to recliner with RW; distance limited with ambulation d/t fatigue/weakness/SOB.  Pt would benefit from skilled PT to address above noted impairments and functional limitations.  Recommend pt discharge to  STR when medically appropriate.     Follow Up Recommendations SNF    Equipment Recommendations       Recommendations for Other Services       Precautions / Restrictions Precautions Precautions: Fall;ICD/Pacemaker Restrictions Weight Bearing Restrictions: No      Mobility  Bed Mobility Overal bed mobility: Needs Assistance Bed Mobility: Supine to Sit     Supine to sit: Min assist;Mod assist;HOB elevated     General bed mobility comments: vc's required for pacemaker precautions and bed mobility technique  Transfers Overall transfer level: Needs assistance Equipment used: Rolling walker (2 wheeled) Transfers: Sit to/from Stand Sit to Stand: Min assist         General transfer comment: pt required vc's for pacemaker precautions and safe transfer technique with RW  Ambulation/Gait Ambulation/Gait assistance: Min assist Ambulation Distance (Feet): 4 Feet Assistive device: Rolling walker (2 wheeled) Gait  Pattern/deviations: Step-to pattern;Shuffle Gait velocity: decreased   General Gait Details: pt demonstrating increased effort to advance B LE's and appeared SOB and fatigued with short distance bed to recliner chair  Stairs            Wheelchair Mobility    Modified Rankin (Stroke Patients Only)       Balance Overall balance assessment: Needs assistance Sitting-balance support: No upper extremity supported;Feet supported Sitting balance-Leahy Scale: Good     Standing balance support: Bilateral upper extremity supported (on RW) Standing balance-Leahy Scale: Fair                               Pertinent Vitals/Pain Pain Assessment: No/denies pain  Vitals stable and WFL throughout treatment session.    Home Living Family/patient expects to be discharged to:: Skilled nursing facility Living Arrangements: Alone Available Help at Discharge: Family Type of Home: Independent living facility [facility]) Home Access: Level entry     Home Layout: One level Home Equipment: Environmental consultant - 4 wheels      Prior Function Level of Independence: Needs assistance   Gait / Transfers Assistance Needed: Pt was independent with 4ww but last 2 1/2 weeks pt has required w/c d/t unable to walk safely and has been having 24/7 assist from family     Comments: Pt does an exercises class 5 days/week     Hand Dominance        Extremity/Trunk Assessment   Upper Extremity Assessment: RUE deficits/detail;LUE deficits/detail RUE Deficits / Details: ROM and strength WFL     LUE Deficits / Details: deferred d/t restrictions post pacemaker  Lower Extremity Assessment: Generalized weakness         Communication   Communication: HOH  Cognition Arousal/Alertness: Awake/alert Behavior During Therapy: WFL for tasks assessed/performed Overall Cognitive Status: Within Functional Limits for tasks assessed       Memory: Decreased recall of precautions               General Comments   Nursing cleared pt for participation in physical therapy.  Pt agreeable to PT session.  Pt's niece present during session.    Exercises   Performed semi-supine B LE therapeutic exercise x 10 reps:  Ankle pumps (AROM B LE's); quad sets x3 second holds (AROM B LE's); glute squeezes x3 second holds (AROM B); SAQ's (AROM R; AROM L); heelslides (AROM R; AROM L), hip abd/adduction (AROM R; AROM L).  Pt required vc's and tactile cues for correct technique with exercises.       Assessment/Plan    PT Assessment Patient needs continued PT services  PT Diagnosis Difficulty walking;Generalized weakness   PT Problem List Decreased strength;Decreased activity tolerance;Decreased balance;Decreased mobility;Decreased knowledge of precautions  PT Treatment Interventions DME instruction;Gait training;Functional mobility training;Therapeutic activities;Therapeutic exercise;Balance training;Patient/family education   PT Goals (Current goals can be found in the Care Plan section) Acute Rehab PT Goals Patient Stated Goal: To get stronger PT Goal Formulation: With patient/family Time For Goal Achievement: 11/04/14 Potential to Achieve Goals: Good    Frequency Min 2X/week   Barriers to discharge        Co-evaluation               End of Session Equipment Utilized During Treatment: Gait belt;Oxygen Activity Tolerance: Patient limited by fatigue Patient left: in chair;with call bell/phone within reach;with chair alarm set;with family/visitor present Nurse Communication: Mobility status         Time: 1610-9604 PT Time Calculation (min) (ACUTE ONLY): 38 min   Charges:   PT Evaluation $Initial PT Evaluation Tier I: 1 Procedure PT Treatments $Therapeutic Exercise: 8-22 mins   PT G CodesHendricks Limes 11-16-14, 4:02 PM Hendricks Limes, PT [phone]

## 2014-10-21 NOTE — Progress Notes (Signed)
[facility]. Patient discharge instructions and status gone over with nurse. Patient to be discharge on RA. Slightly sob with exertion. Iv removed x2 telemetry removed. Steri strips to left chest intaked dried drainage. Packet given to family member and instructed to give to staff at facility [employer]

## 2014-10-22 DIAGNOSIS — Z7901 Long term (current) use of anticoagulants: Secondary | ICD-10-CM | POA: Diagnosis not present

## 2014-10-22 DIAGNOSIS — F419 Anxiety disorder, unspecified: Secondary | ICD-10-CM | POA: Diagnosis not present

## 2014-10-22 DIAGNOSIS — I5022 Chronic systolic (congestive) heart failure: Secondary | ICD-10-CM | POA: Diagnosis not present

## 2014-10-22 DIAGNOSIS — I639 Cerebral infarction, unspecified: Secondary | ICD-10-CM | POA: Diagnosis not present

## 2014-10-22 DIAGNOSIS — R195 Other fecal abnormalities: Secondary | ICD-10-CM | POA: Diagnosis not present

## 2014-10-22 DIAGNOSIS — M6281 Muscle weakness (generalized): Secondary | ICD-10-CM | POA: Diagnosis not present

## 2014-10-22 DIAGNOSIS — I638 Other cerebral infarction: Secondary | ICD-10-CM | POA: Diagnosis not present

## 2014-10-22 DIAGNOSIS — I10 Essential (primary) hypertension: Secondary | ICD-10-CM | POA: Diagnosis not present

## 2014-10-22 DIAGNOSIS — K210 Gastro-esophageal reflux disease with esophagitis: Secondary | ICD-10-CM | POA: Diagnosis not present

## 2014-10-22 DIAGNOSIS — I509 Heart failure, unspecified: Secondary | ICD-10-CM | POA: Diagnosis not present

## 2014-10-22 DIAGNOSIS — I4891 Unspecified atrial fibrillation: Secondary | ICD-10-CM | POA: Diagnosis not present

## 2014-10-22 DIAGNOSIS — Z880 Allergy status to penicillin: Secondary | ICD-10-CM | POA: Diagnosis not present

## 2014-10-22 DIAGNOSIS — K219 Gastro-esophageal reflux disease without esophagitis: Secondary | ICD-10-CM | POA: Diagnosis not present

## 2014-10-22 DIAGNOSIS — I6789 Other cerebrovascular disease: Secondary | ICD-10-CM | POA: Diagnosis not present

## 2014-10-22 DIAGNOSIS — E119 Type 2 diabetes mellitus without complications: Secondary | ICD-10-CM | POA: Diagnosis not present

## 2014-10-22 DIAGNOSIS — M47812 Spondylosis without myelopathy or radiculopathy, cervical region: Secondary | ICD-10-CM | POA: Diagnosis not present

## 2014-10-22 DIAGNOSIS — I471 Supraventricular tachycardia: Secondary | ICD-10-CM | POA: Diagnosis not present

## 2014-10-22 DIAGNOSIS — Z882 Allergy status to sulfonamides status: Secondary | ICD-10-CM | POA: Diagnosis not present

## 2014-10-22 DIAGNOSIS — Z950 Presence of cardiac pacemaker: Secondary | ICD-10-CM | POA: Diagnosis not present

## 2014-10-22 DIAGNOSIS — I6781 Acute cerebrovascular insufficiency: Secondary | ICD-10-CM | POA: Diagnosis not present

## 2014-10-22 DIAGNOSIS — I69322 Dysarthria following cerebral infarction: Secondary | ICD-10-CM | POA: Diagnosis not present

## 2014-10-22 DIAGNOSIS — E785 Hyperlipidemia, unspecified: Secondary | ICD-10-CM | POA: Diagnosis not present

## 2014-10-22 DIAGNOSIS — G510 Bell's palsy: Secondary | ICD-10-CM | POA: Diagnosis not present

## 2014-10-22 DIAGNOSIS — R29810 Facial weakness: Secondary | ICD-10-CM | POA: Diagnosis not present

## 2014-10-22 DIAGNOSIS — N390 Urinary tract infection, site not specified: Secondary | ICD-10-CM | POA: Diagnosis not present

## 2014-10-22 DIAGNOSIS — Z5189 Encounter for other specified aftercare: Secondary | ICD-10-CM | POA: Diagnosis not present

## 2014-10-22 DIAGNOSIS — E039 Hypothyroidism, unspecified: Secondary | ICD-10-CM | POA: Diagnosis not present

## 2014-10-22 DIAGNOSIS — E1140 Type 2 diabetes mellitus with diabetic neuropathy, unspecified: Secondary | ICD-10-CM | POA: Diagnosis not present

## 2014-10-22 DIAGNOSIS — I6350 Cerebral infarction due to unspecified occlusion or stenosis of unspecified cerebral artery: Secondary | ICD-10-CM | POA: Diagnosis not present

## 2014-10-22 DIAGNOSIS — R4781 Slurred speech: Secondary | ICD-10-CM | POA: Diagnosis not present

## 2014-10-23 DIAGNOSIS — G510 Bell's palsy: Secondary | ICD-10-CM | POA: Diagnosis not present

## 2014-10-23 DIAGNOSIS — E785 Hyperlipidemia, unspecified: Secondary | ICD-10-CM | POA: Diagnosis not present

## 2014-10-23 DIAGNOSIS — Z882 Allergy status to sulfonamides status: Secondary | ICD-10-CM | POA: Diagnosis not present

## 2014-10-23 DIAGNOSIS — M47812 Spondylosis without myelopathy or radiculopathy, cervical region: Secondary | ICD-10-CM | POA: Diagnosis not present

## 2014-10-23 DIAGNOSIS — Z5189 Encounter for other specified aftercare: Secondary | ICD-10-CM | POA: Diagnosis not present

## 2014-10-23 DIAGNOSIS — Z7901 Long term (current) use of anticoagulants: Secondary | ICD-10-CM | POA: Diagnosis not present

## 2014-10-23 DIAGNOSIS — Z950 Presence of cardiac pacemaker: Secondary | ICD-10-CM | POA: Diagnosis not present

## 2014-10-23 DIAGNOSIS — I638 Other cerebral infarction: Secondary | ICD-10-CM | POA: Diagnosis not present

## 2014-10-23 DIAGNOSIS — R079 Chest pain, unspecified: Secondary | ICD-10-CM | POA: Diagnosis not present

## 2014-10-23 DIAGNOSIS — I639 Cerebral infarction, unspecified: Secondary | ICD-10-CM | POA: Diagnosis not present

## 2014-10-23 DIAGNOSIS — E039 Hypothyroidism, unspecified: Secondary | ICD-10-CM | POA: Diagnosis not present

## 2014-10-23 DIAGNOSIS — I6350 Cerebral infarction due to unspecified occlusion or stenosis of unspecified cerebral artery: Secondary | ICD-10-CM | POA: Diagnosis not present

## 2014-10-23 DIAGNOSIS — M6281 Muscle weakness (generalized): Secondary | ICD-10-CM | POA: Diagnosis not present

## 2014-10-23 DIAGNOSIS — R9431 Abnormal electrocardiogram [ECG] [EKG]: Secondary | ICD-10-CM | POA: Diagnosis not present

## 2014-10-23 DIAGNOSIS — E1140 Type 2 diabetes mellitus with diabetic neuropathy, unspecified: Secondary | ICD-10-CM | POA: Diagnosis not present

## 2014-10-23 DIAGNOSIS — Z880 Allergy status to penicillin: Secondary | ICD-10-CM | POA: Diagnosis not present

## 2014-10-23 DIAGNOSIS — I6781 Acute cerebrovascular insufficiency: Secondary | ICD-10-CM | POA: Diagnosis not present

## 2014-10-23 DIAGNOSIS — E119 Type 2 diabetes mellitus without complications: Secondary | ICD-10-CM | POA: Diagnosis not present

## 2014-10-23 DIAGNOSIS — I471 Supraventricular tachycardia: Secondary | ICD-10-CM | POA: Diagnosis not present

## 2014-10-23 DIAGNOSIS — R195 Other fecal abnormalities: Secondary | ICD-10-CM | POA: Diagnosis not present

## 2014-10-23 DIAGNOSIS — I4891 Unspecified atrial fibrillation: Secondary | ICD-10-CM | POA: Diagnosis not present

## 2014-10-23 DIAGNOSIS — I69322 Dysarthria following cerebral infarction: Secondary | ICD-10-CM | POA: Diagnosis not present

## 2014-10-23 DIAGNOSIS — N390 Urinary tract infection, site not specified: Secondary | ICD-10-CM | POA: Diagnosis not present

## 2014-10-23 DIAGNOSIS — I10 Essential (primary) hypertension: Secondary | ICD-10-CM | POA: Diagnosis not present

## 2014-10-23 DIAGNOSIS — I509 Heart failure, unspecified: Secondary | ICD-10-CM | POA: Diagnosis not present

## 2014-10-23 DIAGNOSIS — R4781 Slurred speech: Secondary | ICD-10-CM | POA: Diagnosis not present

## 2014-10-23 DIAGNOSIS — I493 Ventricular premature depolarization: Secondary | ICD-10-CM | POA: Diagnosis not present

## 2014-10-23 DIAGNOSIS — K219 Gastro-esophageal reflux disease without esophagitis: Secondary | ICD-10-CM | POA: Diagnosis not present

## 2014-10-23 DIAGNOSIS — I5022 Chronic systolic (congestive) heart failure: Secondary | ICD-10-CM | POA: Diagnosis not present

## 2014-10-23 DIAGNOSIS — R918 Other nonspecific abnormal finding of lung field: Secondary | ICD-10-CM | POA: Diagnosis not present

## 2014-10-23 DIAGNOSIS — R29810 Facial weakness: Secondary | ICD-10-CM | POA: Diagnosis not present

## 2014-10-23 DIAGNOSIS — I6789 Other cerebrovascular disease: Secondary | ICD-10-CM | POA: Diagnosis not present

## 2014-10-25 DIAGNOSIS — I6350 Cerebral infarction due to unspecified occlusion or stenosis of unspecified cerebral artery: Secondary | ICD-10-CM | POA: Diagnosis not present

## 2014-10-25 DIAGNOSIS — M6281 Muscle weakness (generalized): Secondary | ICD-10-CM | POA: Diagnosis not present

## 2014-10-25 DIAGNOSIS — I442 Atrioventricular block, complete: Secondary | ICD-10-CM | POA: Diagnosis not present

## 2014-10-25 DIAGNOSIS — I4891 Unspecified atrial fibrillation: Secondary | ICD-10-CM | POA: Diagnosis not present

## 2014-10-25 DIAGNOSIS — E785 Hyperlipidemia, unspecified: Secondary | ICD-10-CM | POA: Diagnosis not present

## 2014-10-25 DIAGNOSIS — Z5189 Encounter for other specified aftercare: Secondary | ICD-10-CM | POA: Diagnosis not present

## 2014-10-25 DIAGNOSIS — I639 Cerebral infarction, unspecified: Secondary | ICD-10-CM | POA: Diagnosis not present

## 2014-10-25 DIAGNOSIS — R0609 Other forms of dyspnea: Secondary | ICD-10-CM | POA: Diagnosis not present

## 2014-10-25 DIAGNOSIS — I5022 Chronic systolic (congestive) heart failure: Secondary | ICD-10-CM | POA: Diagnosis not present

## 2014-10-25 DIAGNOSIS — R9431 Abnormal electrocardiogram [ECG] [EKG]: Secondary | ICD-10-CM | POA: Diagnosis not present

## 2014-10-25 DIAGNOSIS — K219 Gastro-esophageal reflux disease without esophagitis: Secondary | ICD-10-CM | POA: Diagnosis not present

## 2014-10-25 DIAGNOSIS — I10 Essential (primary) hypertension: Secondary | ICD-10-CM | POA: Diagnosis not present

## 2014-10-25 DIAGNOSIS — I509 Heart failure, unspecified: Secondary | ICD-10-CM | POA: Diagnosis not present

## 2014-10-25 DIAGNOSIS — E039 Hypothyroidism, unspecified: Secondary | ICD-10-CM | POA: Diagnosis not present

## 2014-10-25 DIAGNOSIS — R55 Syncope and collapse: Secondary | ICD-10-CM | POA: Diagnosis not present

## 2014-10-25 DIAGNOSIS — I350 Nonrheumatic aortic (valve) stenosis: Secondary | ICD-10-CM | POA: Diagnosis not present

## 2014-10-25 DIAGNOSIS — E1140 Type 2 diabetes mellitus with diabetic neuropathy, unspecified: Secondary | ICD-10-CM | POA: Diagnosis not present

## 2014-10-25 DIAGNOSIS — I69322 Dysarthria following cerebral infarction: Secondary | ICD-10-CM | POA: Diagnosis not present

## 2014-10-25 DIAGNOSIS — G510 Bell's palsy: Secondary | ICD-10-CM | POA: Diagnosis not present

## 2014-10-25 DIAGNOSIS — F419 Anxiety disorder, unspecified: Secondary | ICD-10-CM | POA: Diagnosis not present

## 2014-10-25 DIAGNOSIS — R0602 Shortness of breath: Secondary | ICD-10-CM | POA: Diagnosis not present

## 2014-10-25 DIAGNOSIS — N390 Urinary tract infection, site not specified: Secondary | ICD-10-CM | POA: Diagnosis not present

## 2014-10-25 DIAGNOSIS — G2581 Restless legs syndrome: Secondary | ICD-10-CM | POA: Diagnosis not present

## 2014-10-25 DIAGNOSIS — I480 Paroxysmal atrial fibrillation: Secondary | ICD-10-CM | POA: Diagnosis not present

## 2014-10-28 DIAGNOSIS — E039 Hypothyroidism, unspecified: Secondary | ICD-10-CM | POA: Diagnosis not present

## 2014-10-28 DIAGNOSIS — G2581 Restless legs syndrome: Secondary | ICD-10-CM | POA: Diagnosis not present

## 2014-10-28 DIAGNOSIS — I480 Paroxysmal atrial fibrillation: Secondary | ICD-10-CM | POA: Diagnosis not present

## 2014-10-28 DIAGNOSIS — I509 Heart failure, unspecified: Secondary | ICD-10-CM | POA: Diagnosis not present

## 2014-10-28 DIAGNOSIS — I442 Atrioventricular block, complete: Secondary | ICD-10-CM | POA: Diagnosis not present

## 2014-10-28 DIAGNOSIS — I4891 Unspecified atrial fibrillation: Secondary | ICD-10-CM | POA: Diagnosis not present

## 2014-10-28 DIAGNOSIS — R55 Syncope and collapse: Secondary | ICD-10-CM | POA: Diagnosis not present

## 2014-10-28 DIAGNOSIS — I6350 Cerebral infarction due to unspecified occlusion or stenosis of unspecified cerebral artery: Secondary | ICD-10-CM | POA: Diagnosis not present

## 2014-10-31 DIAGNOSIS — 419620001 Death: Secondary | SNOMED CT | POA: Diagnosis not present

## 2014-10-31 DEATH — deceased

## 2014-11-05 DIAGNOSIS — E039 Hypothyroidism, unspecified: Secondary | ICD-10-CM | POA: Diagnosis not present

## 2014-11-05 DIAGNOSIS — F419 Anxiety disorder, unspecified: Secondary | ICD-10-CM | POA: Diagnosis not present

## 2014-11-05 DIAGNOSIS — I4891 Unspecified atrial fibrillation: Secondary | ICD-10-CM | POA: Diagnosis not present

## 2014-11-05 DIAGNOSIS — I509 Heart failure, unspecified: Secondary | ICD-10-CM | POA: Diagnosis not present

## 2014-11-05 DIAGNOSIS — I10 Essential (primary) hypertension: Secondary | ICD-10-CM | POA: Diagnosis not present

## 2014-11-12 DIAGNOSIS — I4891 Unspecified atrial fibrillation: Secondary | ICD-10-CM | POA: Diagnosis not present

## 2014-11-12 DIAGNOSIS — I350 Nonrheumatic aortic (valve) stenosis: Secondary | ICD-10-CM | POA: Diagnosis not present

## 2014-11-12 DIAGNOSIS — I480 Paroxysmal atrial fibrillation: Secondary | ICD-10-CM | POA: Diagnosis not present

## 2014-11-12 DIAGNOSIS — I509 Heart failure, unspecified: Secondary | ICD-10-CM | POA: Diagnosis not present

## 2014-11-12 DIAGNOSIS — K219 Gastro-esophageal reflux disease without esophagitis: Secondary | ICD-10-CM | POA: Diagnosis not present

## 2014-11-12 DIAGNOSIS — I6350 Cerebral infarction due to unspecified occlusion or stenosis of unspecified cerebral artery: Secondary | ICD-10-CM | POA: Diagnosis not present

## 2014-11-12 DIAGNOSIS — R0609 Other forms of dyspnea: Secondary | ICD-10-CM | POA: Diagnosis not present

## 2014-11-12 DIAGNOSIS — I442 Atrioventricular block, complete: Secondary | ICD-10-CM | POA: Diagnosis not present

## 2014-11-12 DIAGNOSIS — E039 Hypothyroidism, unspecified: Secondary | ICD-10-CM | POA: Diagnosis not present

## 2014-12-01 DEATH — deceased

## 2016-01-25 IMAGING — CT CT HEAD W/O CM
2 series · 14 of 30 positions shown, 16 images · non-contrast
Comparison: None.

CLINICAL DATA: Multiple syncopal episodes and dizziness.

EXAM:
CT HEAD WITHOUT CONTRAST
TECHNIQUE: Contiguous axial images were obtained from the base of the skull
through the vertex without intravenous contrast.

[Series 2: head wo · axial · 0.40mm/px · z∈[+337,+432]mm · 6 of 27 slices shown, 8 images]
[im 4/27  brain]
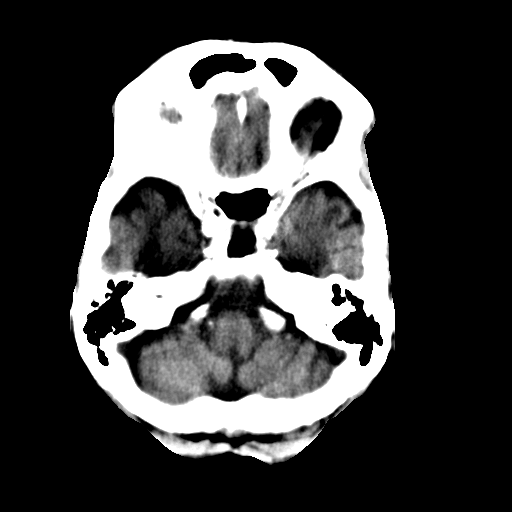
[im 4/27  bone]
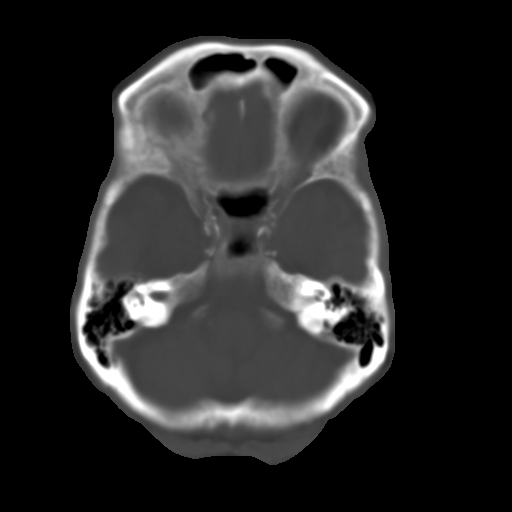
[im 8/27  brain]
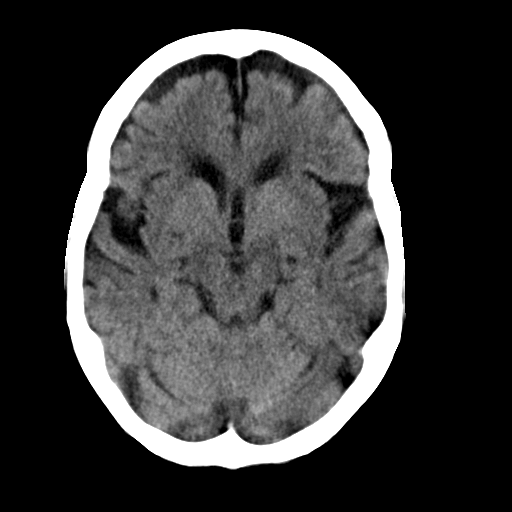
[im 12/27  brain]
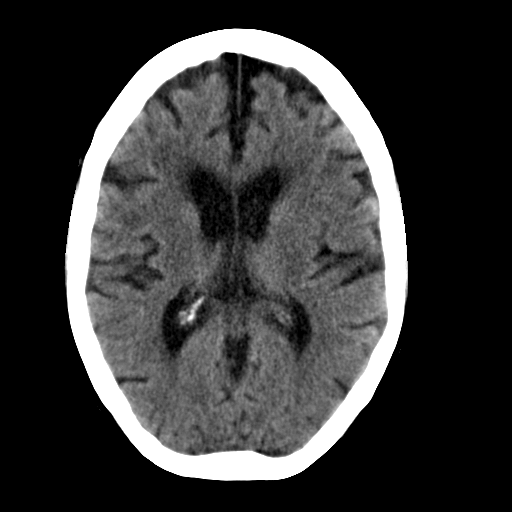
[im 15/27  brain]
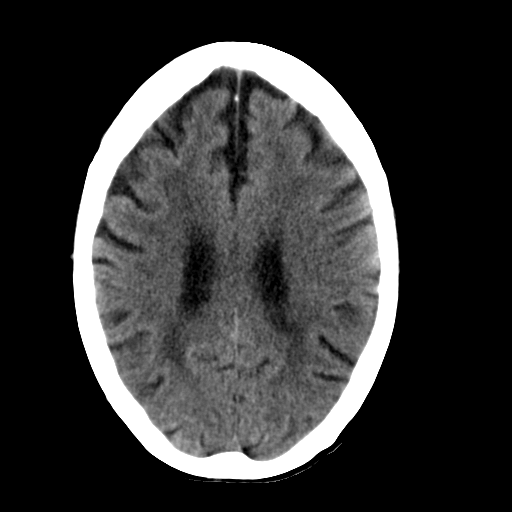
[im 19/27  brain]
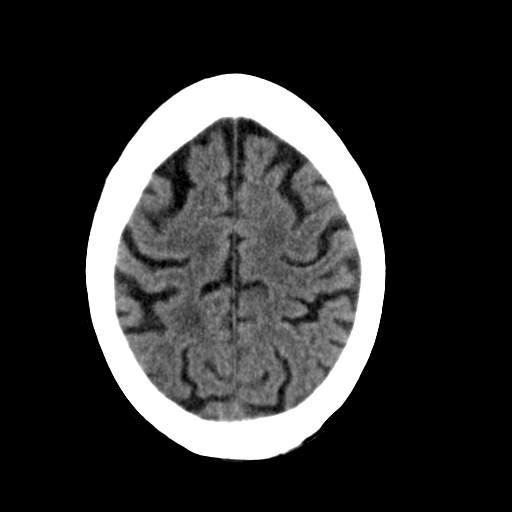
[im 19/27  bone]
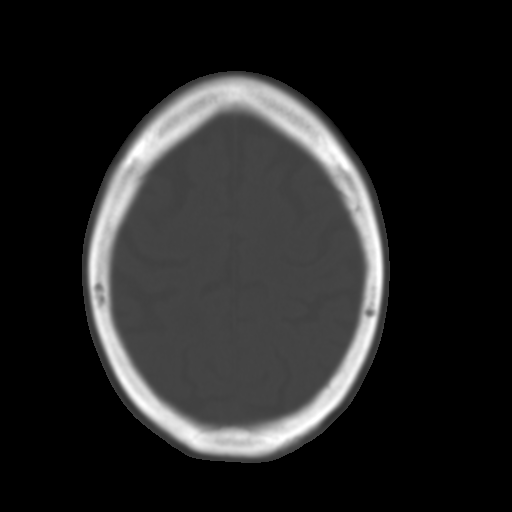
[im 23/27  brain]
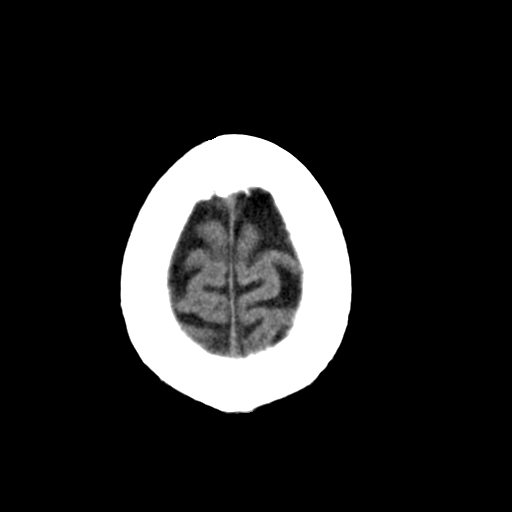

[Series 3: head bone · axial · 0.40mm/px · z∈[+326,+448]mm · 8 of 77 slices shown]
[im 8/77  bone]
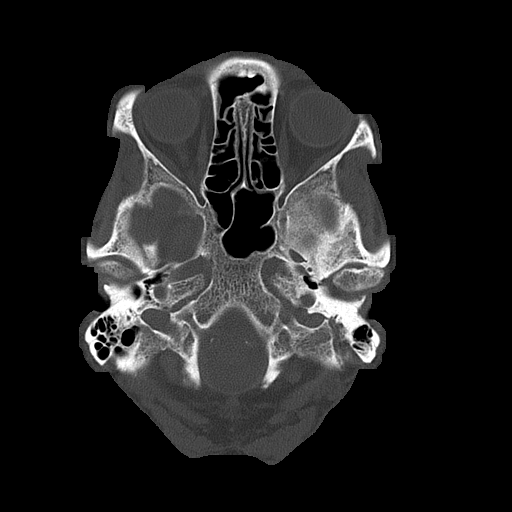
[im 15/77  bone]
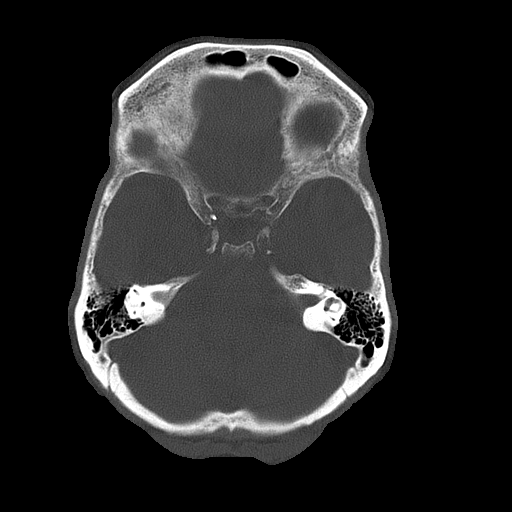
[im 26/77  bone]
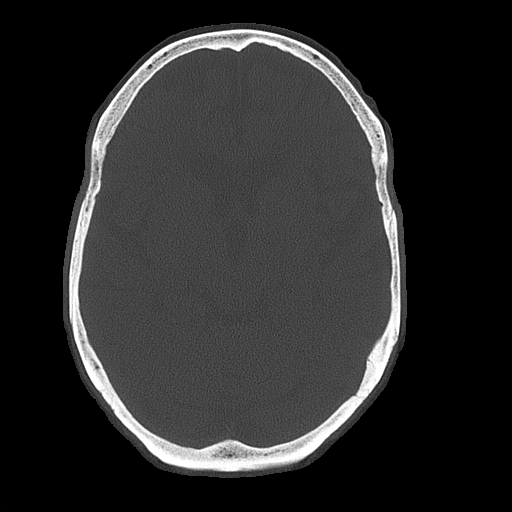
[im 33/77  bone]
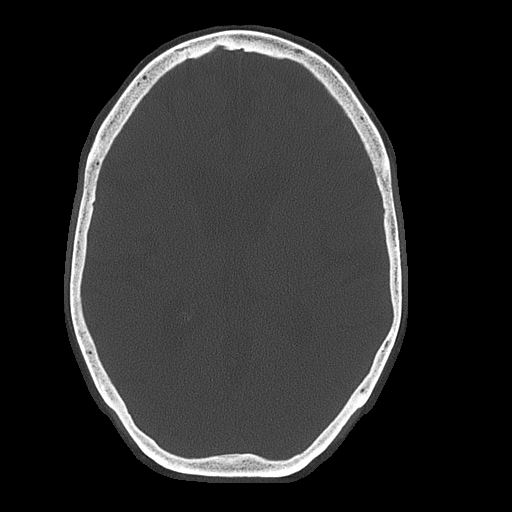
[im 44/77  bone]
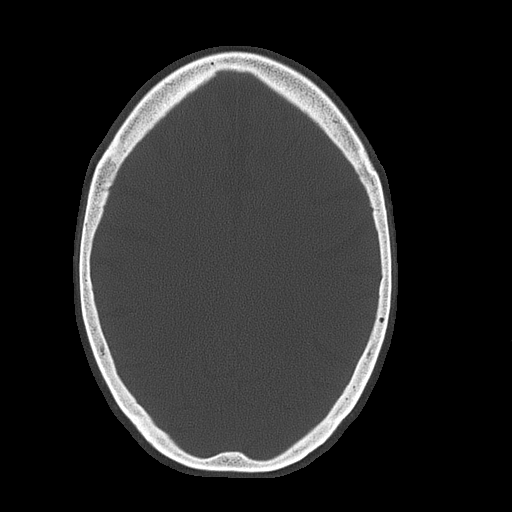
[im 51/77  bone]
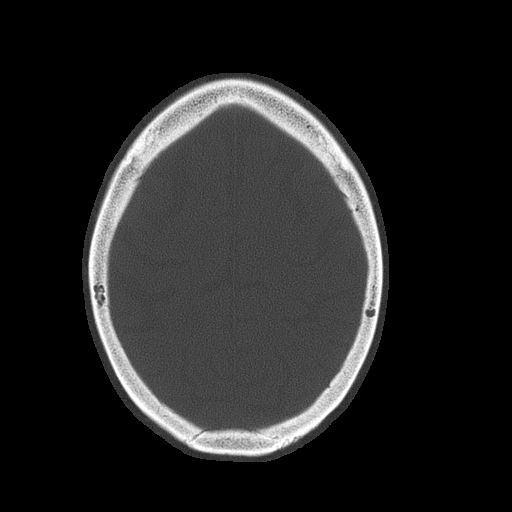
[im 62/77  bone]
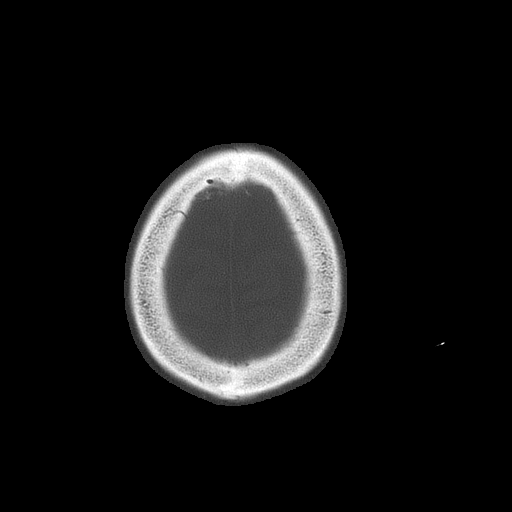
[im 69/77  bone]
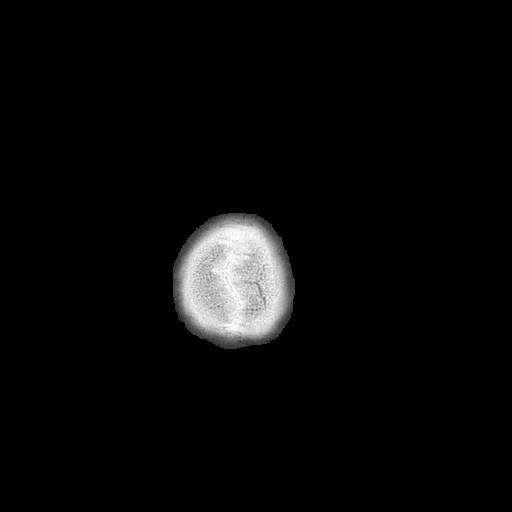

[14 of 30 positions shown; findings below may reference images not displayed]

FINDINGS: There is generalized brain atrophy with commensurate dilatation of
the ventricles and sulci. Mild chronic small vessel ischemic change
noted within the deep periventricular and subcortical white matter
regions bilaterally.

There is no mass, hemorrhage, edema, or other evidence of acute
parenchymal abnormality. No extra-axial hemorrhage. No osseous
abnormality. Visualized upper paranasal sinuses are clear. Mastoid
air cells are clear. Superficial soft tissues are unremarkable.
IMPRESSION: 1. Atrophy and chronic ischemic changes in the white matter.
2. No evidence of acute intracranial abnormality. No intracranial
mass, hemorrhage, or edema.

## 2016-02-19 IMAGING — CR DG CHEST 1V PORT
1 series · 1 of 1 positions shown · non-contrast
Comparison: Portable chest x-ray of 08/30/2014

CLINICAL DATA: Post pacemaker insertion

EXAM:
PORTABLE CHEST - 1 VIEW

[ap]
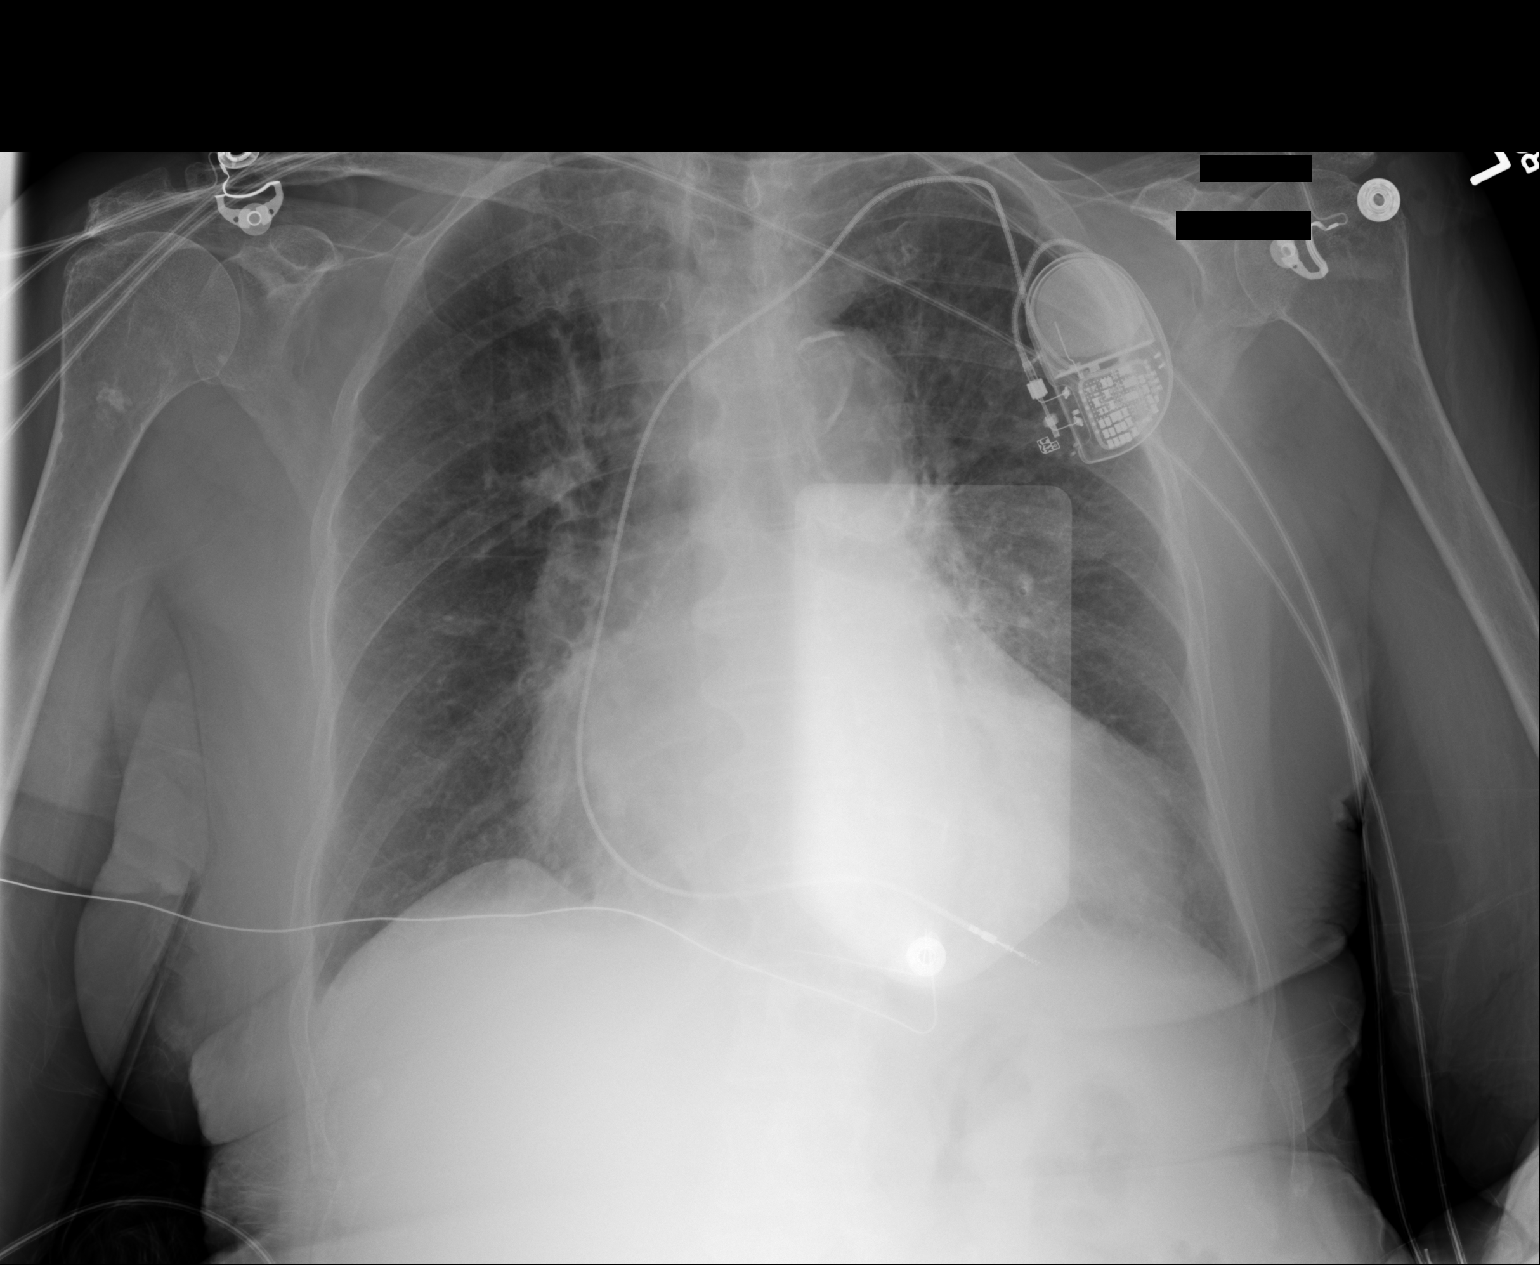

[1 of 1 positions shown; findings below may reference images not displayed]

FINDINGS: A single lead permanent pacemaker is now present. No pneumothorax is
seen. Moderate cardiomegaly is stable. No bony abnormality is seen.
IMPRESSION: Permanent pacemaker now present.  No pneumothorax.

## 2016-08-07 IMAGING — US US CAROTID DUPLEX BILAT
1 series · 13 of 24 positions shown · non-contrast
Comparison: Head CT 09/25/2014 ; prior CTA scan of the neck
07/21/2004

CLINICAL DATA: [AGE] female with several week history of
intermittent syncope

EXAM:
BILATERAL CAROTID DUPLEX ULTRASOUND
TECHNIQUE: Gray scale imaging, color Doppler and duplex ultrasound were
performed of bilateral carotid and vertebral arteries in the neck.

[Series 1: us carotid duplex bilat · 0.06mm/px · 13 of 62 slices shown]
[im 1/62]
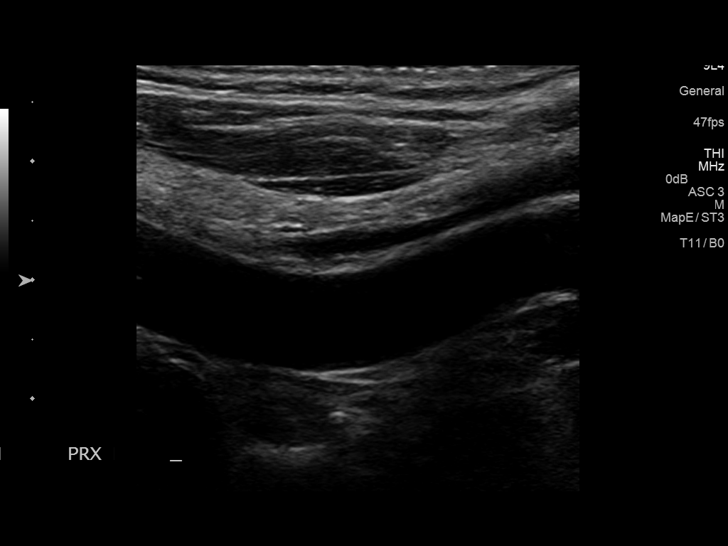
[im 6/62]
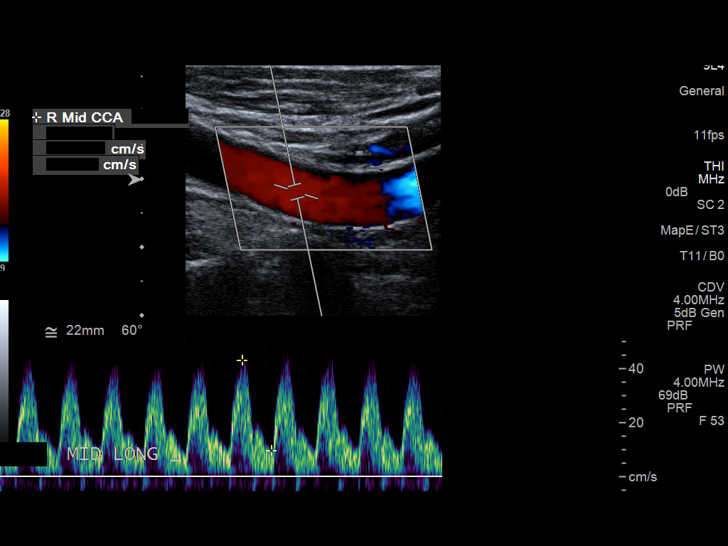
[im 11/62]
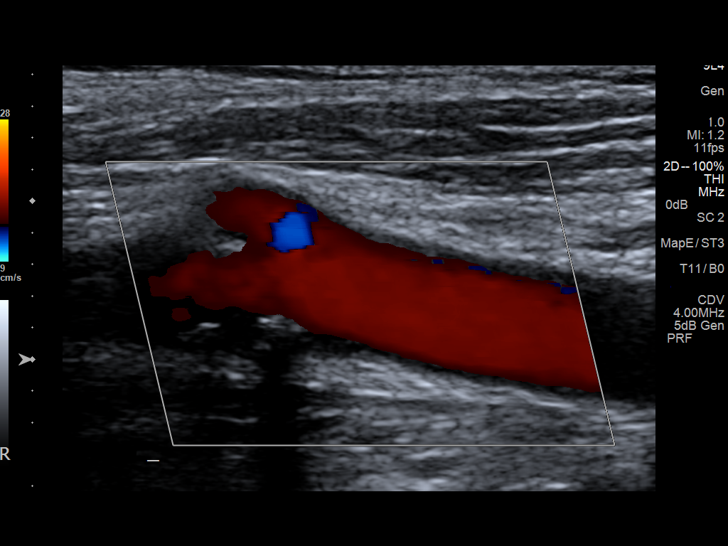
[im 16/62]
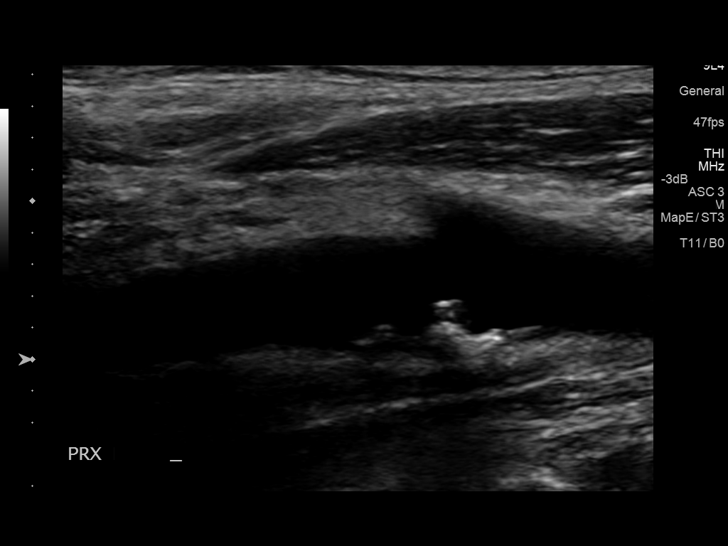
[im 22/62]
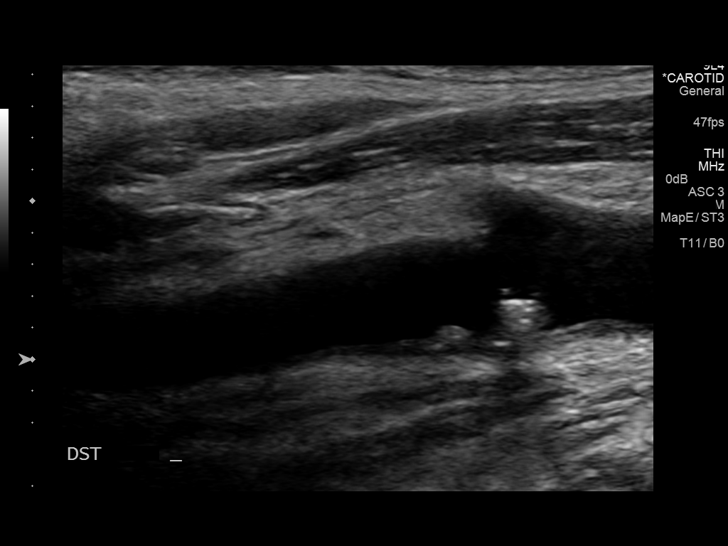
[im 27/62]
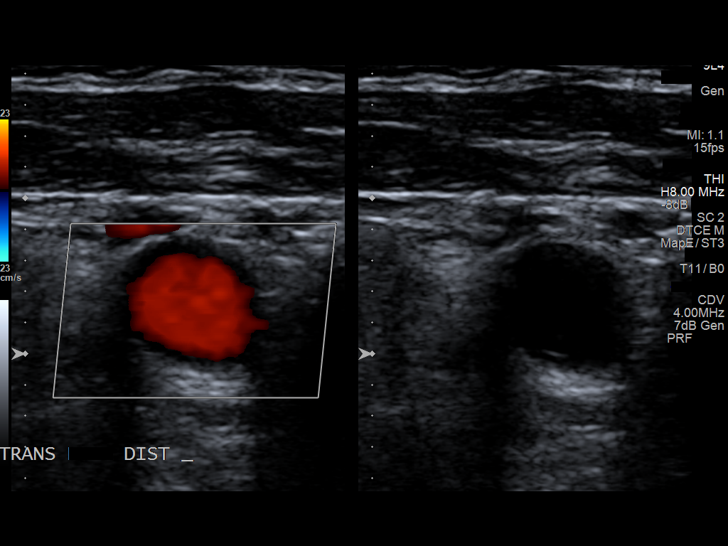
[im 32/62]
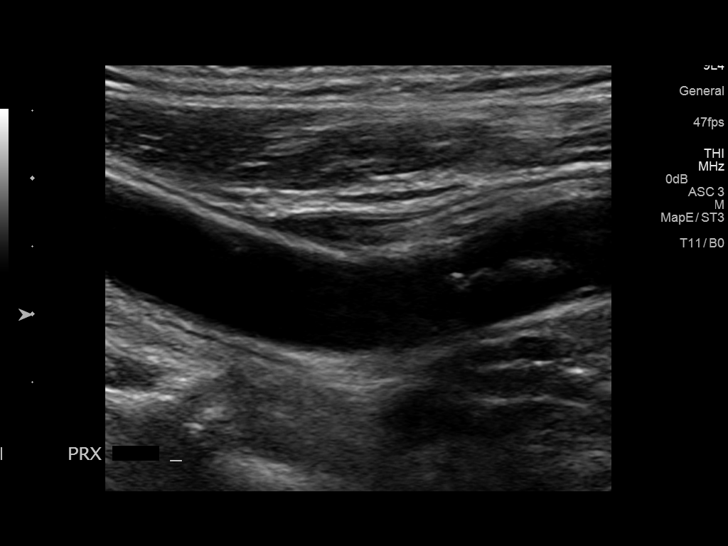
[im 35/62]
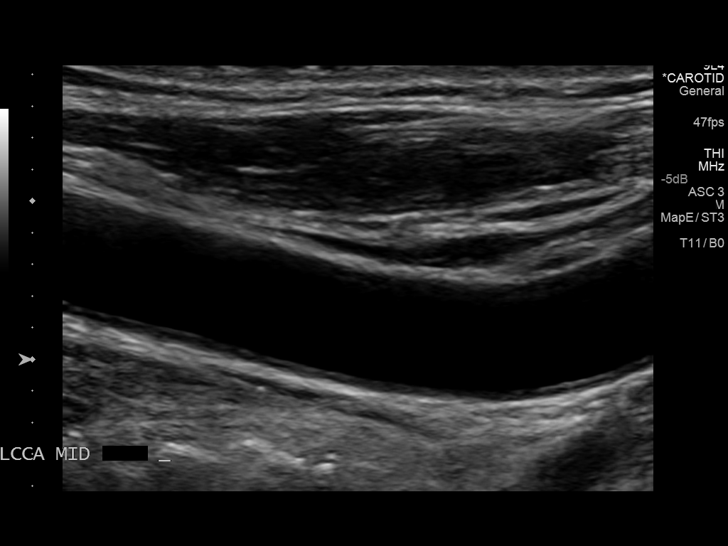
[im 40/62]
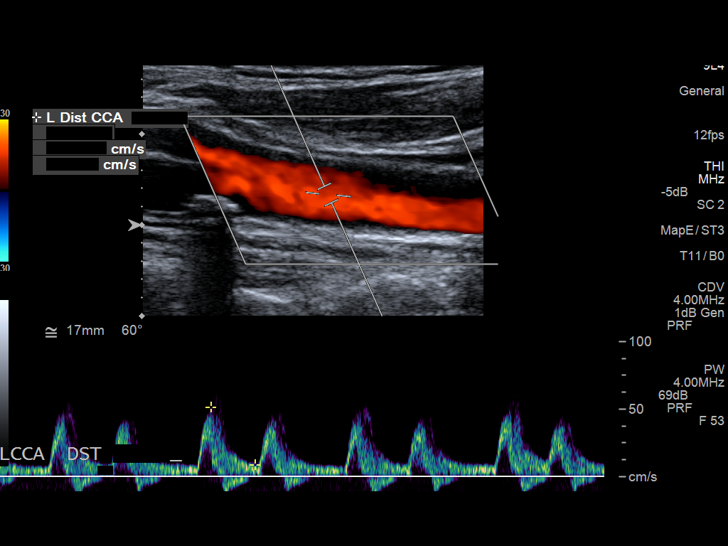
[im 46/62]
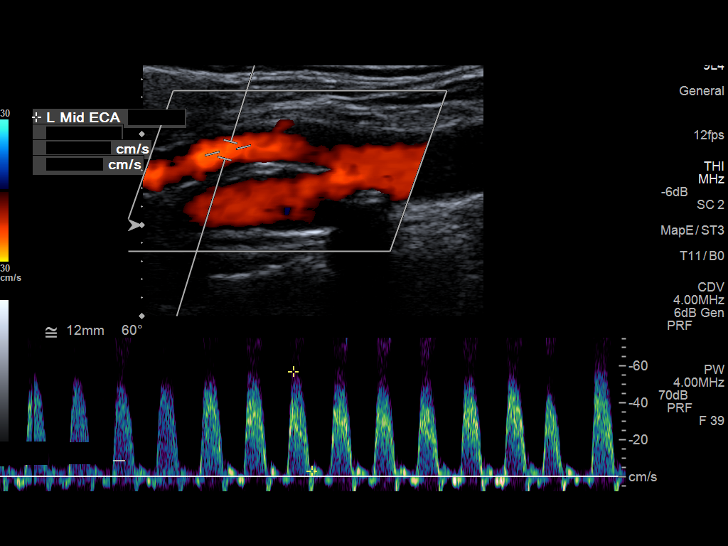
[im 51/62]
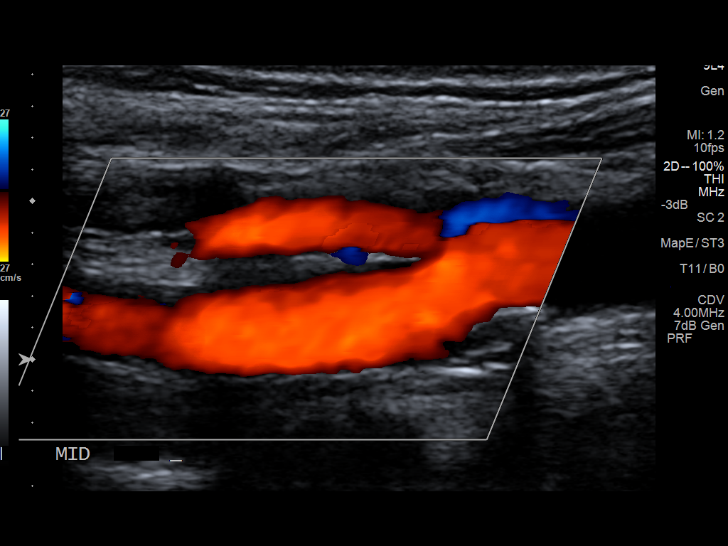
[im 56/62]
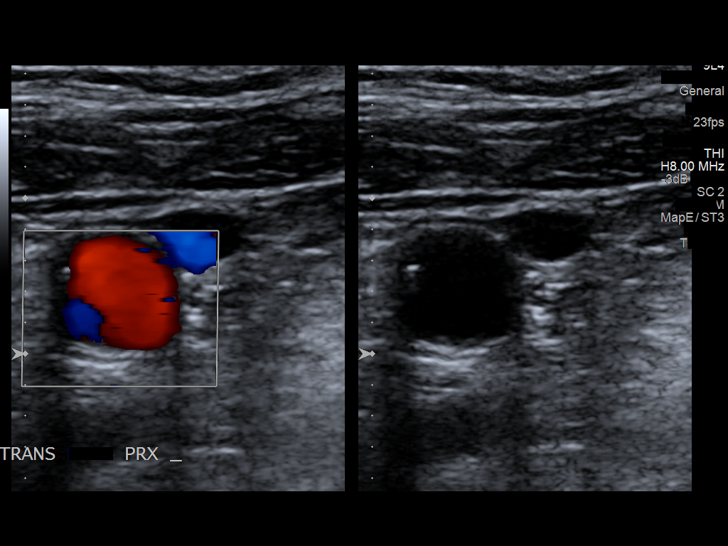
[im 62/62]
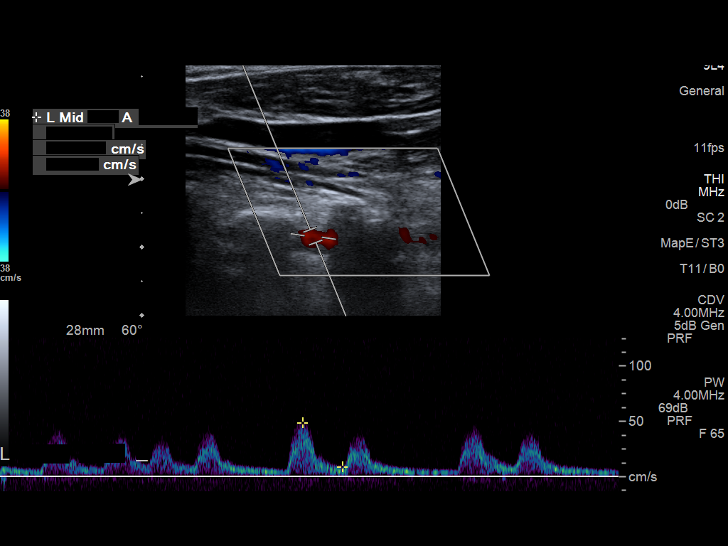

[13 of 24 positions shown; findings below may reference images not displayed]

FINDINGS: Criteria: Quantification of carotid stenosis is based on velocity
parameters that correlate the residual internal carotid diameter
with NASCET-based stenosis levels, using the diameter of the distal
internal carotid lumen as the denominator for stenosis measurement.

The following velocity measurements were obtained:

RIGHT

ICA:  57/19 cm/sec

CCA:  43/10 cm/sec

SYSTOLIC ICA/CCA RATIO:

DIASTOLIC ICA/CCA RATIO:

ECA:  63 cm/sec

LEFT

ICA:  56/14 cm/sec

CCA:  49/9 cm/sec

SYSTOLIC ICA/CCA RATIO:

DIASTOLIC ICA/CCA RATIO:

ECA:  57 cm/sec

RIGHT CAROTID ARTERY: Heterogeneous but focal and partially
calcified atherosclerotic plaque in the carotid bifurcation
extending into the proximal internal and external carotid arteries.
By peak systolic velocity criteria the estimated stenosis remains
less than 50%.

RIGHT VERTEBRAL ARTERY:  Patent with normal antegrade flow.

LEFT CAROTID ARTERY: Scant focal heterogeneous and partially
calcified plaque in the proximal internal carotid artery. By peak
systolic velocity criteria, the estimated stenosis remains less than
50%.

LEFT VERTEBRAL ARTERY: Patent with normal antegrade flow. Of note,
the vertebral artery waveform was intermittently irregular.
IMPRESSION: 1. Mild (1-49%) stenosis proximal right internal carotid artery
secondary to focal heterogeneous and partially calcified
atherosclerotic plaque.
2. Mild (1-49%) stenosis proximal left internal carotid artery
secondary to focal heterogeneous and partially calcified
atherosclerotic plaque.
3. Comparing across modalities to prior CTA neck from 07/21/2004,
there is been no significant interval progression of carotid artery
disease.
4. Vertebral arteries remain patent with normal antegrade flow.
5. Intermittently irregular and tachycardic cardiac rhythm. Does the
patient have a history of atrial fibrillation?

These results will be called to the ordering clinician or
representative by the Radiologist Assistant, and communication
documented in the PACS or zVision Dashboard.
